# Patient Record
Sex: Female | Born: 1985 | Race: Black or African American | Hispanic: No | Marital: Single | Smoking: Current every day smoker
Health system: Southern US, Community
[De-identification: ages and names within clinical notes are randomized; demographics above are authoritative.]

## PROBLEM LIST (undated history)

## (undated) DIAGNOSIS — O009 Unspecified ectopic pregnancy without intrauterine pregnancy: Secondary | ICD-10-CM

## (undated) DIAGNOSIS — N760 Acute vaginitis: Secondary | ICD-10-CM

## (undated) DIAGNOSIS — F32A Depression, unspecified: Secondary | ICD-10-CM

## (undated) DIAGNOSIS — E669 Obesity, unspecified: Secondary | ICD-10-CM

## (undated) DIAGNOSIS — F329 Major depressive disorder, single episode, unspecified: Secondary | ICD-10-CM

## (undated) DIAGNOSIS — F419 Anxiety disorder, unspecified: Secondary | ICD-10-CM

## (undated) HISTORY — DX: Acute vaginitis: N76.0

## (undated) HISTORY — DX: Depression, unspecified: F32.A

## (undated) HISTORY — DX: Unspecified ectopic pregnancy without intrauterine pregnancy: O00.90

## (undated) HISTORY — DX: Anxiety disorder, unspecified: F41.9

## (undated) HISTORY — DX: Obesity, unspecified: E66.9

## (undated) HISTORY — PX: TONSILLECTOMY: SUR1361

---
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
## 2004-11-14 ENCOUNTER — Emergency Department: Payer: Self-pay | Admitting: Internal Medicine

## 2004-11-30 HISTORY — PX: ECTOPIC PREGNANCY SURGERY: SHX613

## 2005-09-30 ENCOUNTER — Ambulatory Visit: Payer: Self-pay | Admitting: Family Medicine

## 2005-10-09 ENCOUNTER — Ambulatory Visit: Payer: Self-pay | Admitting: Obstetrics and Gynecology

## 2005-11-30 HISTORY — PX: TONSILLECTOMY: SUR1361

## 2006-02-09 ENCOUNTER — Ambulatory Visit: Payer: Self-pay | Admitting: Otolaryngology

## 2006-10-11 ENCOUNTER — Emergency Department: Payer: Self-pay | Admitting: Emergency Medicine

## 2007-01-06 ENCOUNTER — Observation Stay: Payer: Self-pay | Admitting: Obstetrics and Gynecology

## 2007-03-07 ENCOUNTER — Observation Stay: Payer: Self-pay | Admitting: Obstetrics and Gynecology

## 2007-03-23 ENCOUNTER — Observation Stay: Payer: Self-pay | Admitting: Obstetrics and Gynecology

## 2007-04-06 ENCOUNTER — Ambulatory Visit: Payer: Self-pay | Admitting: Obstetrics and Gynecology

## 2007-04-17 ENCOUNTER — Observation Stay: Payer: Self-pay | Admitting: Obstetrics and Gynecology

## 2007-04-22 ENCOUNTER — Observation Stay: Payer: Self-pay | Admitting: Obstetrics and Gynecology

## 2007-04-23 ENCOUNTER — Inpatient Hospital Stay: Payer: Self-pay | Admitting: Obstetrics and Gynecology

## 2008-11-30 HISTORY — PX: DILATION AND CURETTAGE OF UTERUS: SHX78

## 2008-12-22 ENCOUNTER — Emergency Department: Payer: Self-pay | Admitting: Emergency Medicine

## 2009-06-03 ENCOUNTER — Observation Stay: Payer: Self-pay

## 2009-07-22 ENCOUNTER — Observation Stay: Payer: Self-pay

## 2009-07-24 ENCOUNTER — Observation Stay: Payer: Self-pay

## 2009-07-30 ENCOUNTER — Observation Stay: Payer: Self-pay | Admitting: Unknown Physician Specialty

## 2009-08-03 ENCOUNTER — Inpatient Hospital Stay: Payer: Self-pay | Admitting: Obstetrics & Gynecology

## 2009-08-16 ENCOUNTER — Ambulatory Visit: Payer: Self-pay | Admitting: Obstetrics & Gynecology

## 2010-03-21 ENCOUNTER — Emergency Department: Payer: Self-pay

## 2012-04-05 ENCOUNTER — Ambulatory Visit: Payer: Self-pay | Admitting: Internal Medicine

## 2017-05-30 ENCOUNTER — Emergency Department (HOSPITAL_BASED_OUTPATIENT_CLINIC_OR_DEPARTMENT_OTHER): Payer: BLUE CROSS/BLUE SHIELD

## 2017-05-30 ENCOUNTER — Emergency Department (HOSPITAL_BASED_OUTPATIENT_CLINIC_OR_DEPARTMENT_OTHER)
Admission: EM | Admit: 2017-05-30 | Discharge: 2017-05-31 | Disposition: A | Discharge status: Home / Self Care | Payer: BLUE CROSS/BLUE SHIELD | Attending: Emergency Medicine | Admitting: Emergency Medicine

## 2017-05-30 ENCOUNTER — Encounter (HOSPITAL_BASED_OUTPATIENT_CLINIC_OR_DEPARTMENT_OTHER): Payer: Self-pay | Admitting: *Deleted

## 2017-05-30 DIAGNOSIS — R1030 Lower abdominal pain, unspecified: Secondary | ICD-10-CM | POA: Diagnosis present

## 2017-05-30 DIAGNOSIS — R109 Unspecified abdominal pain: Secondary | ICD-10-CM

## 2017-05-30 DIAGNOSIS — F17200 Nicotine dependence, unspecified, uncomplicated: Secondary | ICD-10-CM | POA: Insufficient documentation

## 2017-05-30 LAB — CBC WITH DIFFERENTIAL/PLATELET
BASOS ABS: 0 10*3/uL (ref 0.0–0.1)
BASOS PCT: 0 %
EOS PCT: 0 %
Eosinophils Absolute: 0 10*3/uL (ref 0.0–0.7)
HCT: 38.8 % (ref 36.0–46.0)
Hemoglobin: 13.4 g/dL (ref 12.0–15.0)
Lymphocytes Relative: 15 %
Lymphs Abs: 1.6 10*3/uL (ref 0.7–4.0)
MCH: 30.6 pg (ref 26.0–34.0)
MCHC: 34.5 g/dL (ref 30.0–36.0)
MCV: 88.6 fL (ref 78.0–100.0)
MONO ABS: 0.7 10*3/uL (ref 0.1–1.0)
Monocytes Relative: 6 %
NEUTROS PCT: 79 %
Neutro Abs: 8.4 10*3/uL — ABNORMAL HIGH (ref 1.7–7.7)
PLATELETS: 278 10*3/uL (ref 150–400)
RBC: 4.38 MIL/uL (ref 3.87–5.11)
RDW: 12.5 % (ref 11.5–15.5)
WBC: 10.7 10*3/uL — AB (ref 4.0–10.5)

## 2017-05-30 LAB — COMPREHENSIVE METABOLIC PANEL
ALBUMIN: 4 g/dL (ref 3.5–5.0)
ALT: 12 U/L — ABNORMAL LOW (ref 14–54)
AST: 17 U/L (ref 15–41)
Alkaline Phosphatase: 40 U/L (ref 38–126)
Anion gap: 8 (ref 5–15)
BUN: 15 mg/dL (ref 6–20)
CHLORIDE: 107 mmol/L (ref 101–111)
CO2: 23 mmol/L (ref 22–32)
Calcium: 9.8 mg/dL (ref 8.9–10.3)
Creatinine, Ser: 1.17 mg/dL — ABNORMAL HIGH (ref 0.44–1.00)
GFR calc Af Amer: >60 mL/min (ref >60)
GFR calc non Af Amer: >60 mL/min (ref >60)
GLUCOSE: 100 mg/dL — AB (ref 65–99)
POTASSIUM: 3.8 mmol/L (ref 3.5–5.1)
SODIUM: 138 mmol/L (ref 135–145)
Total Bilirubin: 0.7 mg/dL (ref 0.3–1.2)
Total Protein: 7.5 g/dL (ref 6.5–8.1)

## 2017-05-30 LAB — URINALYSIS, ROUTINE W REFLEX MICROSCOPIC
BILIRUBIN URINE: NEGATIVE
Glucose, UA: NEGATIVE mg/dL
KETONES UR: NEGATIVE mg/dL
Nitrite: NEGATIVE
Protein, ur: NEGATIVE mg/dL
SPECIFIC GRAVITY, URINE: 1.017 (ref 1.005–1.030)
pH: 7 (ref 5.0–8.0)

## 2017-05-30 LAB — URINALYSIS, MICROSCOPIC (REFLEX)

## 2017-05-30 LAB — WET PREP, GENITAL
Clue Cells Wet Prep HPF POC: NONE SEEN
SPERM: NONE SEEN
Trich, Wet Prep: NONE SEEN
Yeast Wet Prep HPF POC: NONE SEEN

## 2017-05-30 LAB — PREGNANCY, URINE: PREG TEST UR: NEGATIVE

## 2017-05-30 MED ORDER — AZITHROMYCIN 1 G PO PACK
1.0000 g | PACK | Freq: Once | ORAL | Status: AC
  Administered 2017-05-30: 1 g via ORAL
  Filled 2017-05-30: qty 1

## 2017-05-30 MED ORDER — CEFTRIAXONE SODIUM 250 MG IJ SOLR
250.0000 mg | Freq: Once | INTRAMUSCULAR | Status: AC
  Administered 2017-05-30: 250 mg via INTRAMUSCULAR
  Filled 2017-05-30: qty 250

## 2017-05-30 MED ORDER — KETOROLAC TROMETHAMINE 15 MG/ML IJ SOLN
15.0000 mg | Freq: Once | INTRAMUSCULAR | Status: AC
  Administered 2017-05-30: 15 mg via INTRAVENOUS
  Filled 2017-05-30: qty 1

## 2017-05-30 MED ORDER — SODIUM CHLORIDE 0.9 % IV BOLUS (SEPSIS)
1000.0000 mL | Freq: Once | INTRAVENOUS | Status: AC
  Administered 2017-05-30: 1000 mL via INTRAVENOUS

## 2017-05-30 MED ORDER — LIDOCAINE HCL (PF) 1 % IJ SOLN
INTRAMUSCULAR | Status: AC
  Administered 2017-05-30: 5 mL
  Filled 2017-05-30: qty 5

## 2017-05-30 NOTE — ED Notes (Signed)
Back up to b/r, steady gait, urinary frequency noted.

## 2017-05-30 NOTE — ED Triage Notes (Signed)
Pt reports lower abdominal pain and left flank pain since yesterday. No n/v/d. Pt says that she feels urgency but only goes a small amount.

## 2017-05-30 NOTE — ED Notes (Signed)
Back up to b/r (3rd time), steady gait.

## 2017-05-30 NOTE — ED Provider Notes (Signed)
[facility] Note   CSN: [id number] Arrival date & time: 05/30/17  2053  By signing my name below, I, Marilyn Li, attest that this documentation has been prepared under the direction and in the presence of Marilyn Monday, MD. Electronically Signed: Diona Li, ED Scribe. 05/30/17. 10:14 PM.  History   Chief Complaint Chief Complaint  Patient presents with  . Abdominal Pain    HPI Marilyn Li is a 31 y.o. female who presents to the Emergency Department complaining of constant lower abdominal pain that started yesterday. Associated sx include achy left flank pain, pelvic pain, urinary urgency, and decreased urinary output. Movement exacerbates her pain. Never had abdominal pain like this before. Hx of ectopic pregnancy. Pt denies dysuria, nausea, vomiting, diarrhea, fever, vaginal bleeding, vaginal discharge and appetite change.   The history is provided by the patient. No language interpreter was used.    History reviewed. No pertinent past medical history.  There are no active problems to display for this patient.   Past Surgical History:  Procedure Laterality Date  . TONSILLECTOMY      OB History    No data available       Home Medications    Prior to Admission medications   Medication Sig Start Date End Date Taking? Authorizing Provider  cyclobenzaprine (FLEXERIL) 10 MG tablet Take 1 tablet (10 mg total) by mouth 2 (two) times daily as needed for muscle spasms. 05/31/17   Marilyn Monday, MD    Family History No family history on file.  Social History Social History  Substance Use Topics  . Smoking status: Current Every Day Smoker  . Smokeless tobacco: Not on file  . Alcohol use Yes     Allergies   Patient has no known allergies.   Review of Systems Review of Systems  Constitutional: Negative for appetite change and fever.  HENT: Negative for sore throat.   Eyes: Negative for visual disturbance.  Respiratory:  Negative for cough and shortness of breath.   Cardiovascular: Negative for chest pain.  Gastrointestinal: Positive for abdominal pain. Negative for diarrhea, nausea and vomiting.  Genitourinary: Positive for decreased urine volume, flank pain and urgency. Negative for difficulty urinating, vaginal bleeding and vaginal discharge.  Musculoskeletal: Negative for back pain and neck pain.  Skin: Negative for rash.  Neurological: Negative for syncope and headaches.     Physical Exam Updated Vital Signs BP 116/77 (BP Location: Left Arm)   Pulse 80   Temp 99.5 F (37.5 C) (Oral)   Resp 20   LMP 05/23/2017   SpO2 100%   Physical Exam  Constitutional: She is oriented to person, place, and time. She appears well-developed and well-nourished. No distress.  HENT:  Head: Normocephalic and atraumatic.  Eyes: Conjunctivae and EOM are normal.  Neck: Normal range of motion.  Cardiovascular: Normal rate, regular rhythm, normal heart sounds and intact distal pulses.  Exam reveals no gallop and no friction rub.   No murmur heard. Pulmonary/Chest: Effort normal and breath sounds normal. No respiratory distress. She has no wheezes. She has no rales.  Abdominal: Soft. She exhibits no distension. There is no tenderness. There is no guarding.  Mild bilateral lower abdominal tenderness.   Genitourinary: Cervix exhibits no motion tenderness and no discharge. Right adnexum displays no tenderness. Left adnexum displays no tenderness.  Musculoskeletal: She exhibits no edema or tenderness.  No tenderness in the left lower back. No CVA tenderness.   Neurological: She is alert and oriented to person,  place, and time.  Skin: Skin is warm and dry. No rash noted. She is not diaphoretic. No erythema.  Nursing note and vitals reviewed.   ED Treatments / Results  DIAGNOSTIC STUDIES: Oxygen Saturation is 98% on RA, normal by my interpretation.   COORDINATION OF CARE: 10:14 PM-Discussed next steps with pt which  includes a CT scan and following up with her PCP. Pt verbalized understanding and is agreeable with the plan.   Labs (all labs ordered are listed, but only abnormal results are displayed) Labs Reviewed  WET PREP, GENITAL - Abnormal; Notable for the following:       Result Value   WBC, Wet Prep HPF POC MANY (*)    All other components within normal limits  URINE CULTURE - Abnormal; Notable for the following:    Culture MULTIPLE SPECIES PRESENT, SUGGEST RECOLLECTION (*)    All other components within normal limits  URINALYSIS, ROUTINE W REFLEX MICROSCOPIC - Abnormal; Notable for the following:    APPearance CLOUDY (*)    Hgb urine dipstick MODERATE (*)    Leukocytes, UA TRACE (*)    All other components within normal limits  URINALYSIS, MICROSCOPIC (REFLEX) - Abnormal; Notable for the following:    Bacteria, UA FEW (*)    Squamous Epithelial / LPF 6-30 (*)    All other components within normal limits  CBC WITH DIFFERENTIAL/PLATELET - Abnormal; Notable for the following:    WBC 10.7 (*)    Neutro Abs 8.4 (*)    All other components within normal limits  COMPREHENSIVE METABOLIC PANEL - Abnormal; Notable for the following:    Glucose, Bld 100 (*)    Creatinine, Ser 1.17 (*)    ALT 12 (*)    All other components within normal limits  PREGNANCY, URINE  GC/CHLAMYDIA PROBE AMP ([place]) NOT AT [facility]    EKG  EKG Interpretation None       Radiology No results found.  Procedures Procedures (including critical care time)  Medications Ordered in ED Medications  ketorolac (TORADOL) 15 MG/ML injection 15 mg (15 mg Intravenous Given 05/30/17 2259)  sodium chloride 0.9 % bolus 1,000 mL (0 mLs Intravenous Stopped 05/30/17 2328)  cefTRIAXone (ROCEPHIN) injection 250 mg (250 mg Intramuscular Given 05/30/17 2345)  azithromycin (ZITHROMAX) powder 1 g (1 g Oral Given 05/30/17 2345)  lidocaine (PF) (XYLOCAINE) 1 % injection (5 mLs  Given 05/30/17 2345)     Initial Impression / Assessment and  Plan / ED Course  I have reviewed the triage vital signs and the nursing notes.  Pertinent labs & imaging results that were available during my care of the patient were reviewed by me and considered in my medical decision making (see chart for details).     31yo female presents with concern for lower abdominal and flank pain.  Pt with urinary symptoms however no sign of urinary tract infection on urinalysis. RBCs present, and discussed getting CT scan however pt without severe colicky pain and symptoms a bit atypical for nephrolithiasis. Pt declines CT and given symptoms feel this is reasonable. Doubt appendicitis, diverticulitis, obstruction. Wet prep shows no significant findings. No significant discharge or tenderness to suggest PID. Pt give empiric tx with azithromycin/rocephin.  Recommend hydration, PCP follow up. Possible MSK pain, less likely nephrolithiasis. Patient discharged in stable condition with understanding of reasons to return.    I personally performed the services described in this documentation, which was scribed in my presence. The recorded information has been reviewed and is  accurate.   Final Clinical Impressions(s) / ED Diagnoses   Final diagnoses:  Flank pain  Lower abdominal pain    New Prescriptions There are no discharge medications for this patient.    Marilyn MondaySchlossman, Arcenio Mullaly, MD 06/01/17 1210

## 2017-05-30 NOTE — ED Notes (Signed)
Up to b/r, steady gait 

## 2017-05-31 MED ORDER — CYCLOBENZAPRINE HCL 10 MG PO TABS: 10.0000 mg | ORAL_TABLET | Freq: Two times a day (BID) | ORAL | 0 refills | Status: DC | PRN

## 2017-05-31 NOTE — ED Notes (Signed)
EDP in to speak with pt about results and d/c plan.  

## 2017-06-01 LAB — URINE CULTURE

## 2017-11-16 ENCOUNTER — Ambulatory Visit: Payer: BLUE CROSS/BLUE SHIELD | Admitting: Nurse Practitioner

## 2017-11-16 ENCOUNTER — Encounter: Payer: Self-pay | Admitting: Nurse Practitioner

## 2017-11-16 VITALS — BP 130/80 | HR 72 | Temp 98.2°F | Resp 16 | Ht 64.0 in | Wt 253.0 lb

## 2017-11-16 DIAGNOSIS — Z0001 Encounter for general adult medical examination with abnormal findings: Secondary | ICD-10-CM | POA: Insufficient documentation

## 2017-11-16 DIAGNOSIS — N390 Urinary tract infection, site not specified: Secondary | ICD-10-CM | POA: Diagnosis not present

## 2017-11-16 DIAGNOSIS — B3731 Acute candidiasis of vulva and vagina: Secondary | ICD-10-CM

## 2017-11-16 DIAGNOSIS — B373 Candidiasis of vulva and vagina: Secondary | ICD-10-CM

## 2017-11-16 DIAGNOSIS — N760 Acute vaginitis: Secondary | ICD-10-CM | POA: Diagnosis not present

## 2017-11-16 DIAGNOSIS — R319 Hematuria, unspecified: Secondary | ICD-10-CM | POA: Diagnosis not present

## 2017-11-16 LAB — POCT URINALYSIS DIPSTICK
GLUCOSE UA: NEGATIVE
Ketones, UA: NEGATIVE
Leukocytes, UA: NEGATIVE
Nitrite, UA: NEGATIVE
PH UA: 7 (ref 5.0–8.0)
Spec Grav, UA: 1.01 (ref 1.010–1.025)
Urobilinogen, UA: 0.2 E.U./dL

## 2017-11-16 MED ORDER — METRONIDAZOLE 500 MG PO TABS: 500.0000 mg | ORAL_TABLET | Freq: Two times a day (BID) | ORAL | 0 refills | Status: AC

## 2017-11-16 MED ORDER — CIPROFLOXACIN HCL 500 MG PO TABS: 500.0000 mg | ORAL_TABLET | Freq: Two times a day (BID) | ORAL | 0 refills | Status: AC

## 2017-11-16 MED ORDER — FLUCONAZOLE 150 MG PO TABS: 150.0000 mg | ORAL_TABLET | Freq: Once | ORAL | 0 refills | Status: DC

## 2017-11-16 MED ORDER — CIPROFLOXACIN HCL 500 MG PO TABS: 500.0000 mg | ORAL_TABLET | Freq: Two times a day (BID) | ORAL | 0 refills | Status: DC

## 2017-11-16 MED ORDER — METRONIDAZOLE 500 MG PO TABS: 500.0000 mg | ORAL_TABLET | Freq: Three times a day (TID) | ORAL | 0 refills | Status: DC

## 2017-11-16 NOTE — Progress Notes (Signed)
Subjective:     Patient ID: Marilyn GrahamLatasha M Conant, female   DOB: [birth year], 31 y.o.   MRN: [id number]  Patient has noted vaginal discharge with odor for about a week. Odor is described as "fishy" and she is also having some vaginal itching and irriitation.  She started having right flank pain a few days ago. She describes this as slight pain. She is reporting some urgency but no burning or pain when she urinates. She has noted no blood in the urine.      Review of Systems  Constitutional: Negative for activity change, appetite change, fatigue and fever.  HENT: Negative.   Eyes: Negative.   Respiratory: Negative for chest tightness and shortness of breath.   Cardiovascular: Negative for chest pain and palpitations.  Gastrointestinal: Positive for abdominal pain.       Abdominal pain is along the pelvic area.   Endocrine: Negative.   Genitourinary: Positive for dysuria, flank pain, frequency, urgency, vaginal discharge and vaginal pain.  Allergic/Immunologic: Negative.   Neurological: Negative.   Hematological: Negative.   Psychiatric/Behavioral: Negative.        Objective:   Physical Exam  Constitutional: She is oriented to person, place, and time. She appears well-developed and well-nourished.  HENT:  Head: Normocephalic and atraumatic.  Neck: Normal range of motion. Neck supple.  Cardiovascular: Normal rate and regular rhythm.  Pulmonary/Chest: Effort normal and breath sounds normal.  Abdominal: Soft. Normal appearance. There is tenderness in the suprapubic area.  Genitourinary:  Genitourinary Comments: The patient's urine sample is positive for moderate blood and small WBC.   Musculoskeletal: Normal range of motion.  Neurological: She is alert and oriented to person, place, and time.  Skin: Skin is warm and dry.  Psychiatric: She has a normal mood and affect.       Assessment:     Candidal vaginitis - Plan: fluconazole (DIFLUCAN) 150 MG tablet, DISCONTINUED: fluconazole  (DIFLUCAN) 150 MG tablet  Encounter for well adult exam with abnormal findings - Plan: POCT Urinalysis Dipstick  Urinary tract infection with hematuria, site unspecified - Plan: Urine Culture, ciprofloxacin (CIPRO) 500 MG tablet, DISCONTINUED: ciprofloxacin (CIPRO) 500 MG tablet  Vaginitis and vulvovaginitis - Plan: metroNIDAZOLE (FLAGYL) 500 MG tablet     Plan:     1. uti - treat wit hciprofloxacin 500mg  bid for 7 days. Send urine for culture and sensitivity and adjust abx as indicated.  2. Vaginitis - likely bacterial in nature. Metronidazole 500mg  BID for 7 days given. Advised patient not to consume any alcohol while taking this medicatin. She voiced understanding.  3. Candidal vaginitis - diflucan prescrpbed in case yeast infection develops. Use as needed and as prescribed   Return in 2 weeks to recheck u/a for infection

## 2017-11-18 LAB — URINE CULTURE

## 2017-12-17 ENCOUNTER — Ambulatory Visit: Payer: Self-pay | Admitting: Nurse Practitioner

## 2018-05-27 ENCOUNTER — Ambulatory Visit: Payer: BLUE CROSS/BLUE SHIELD | Admitting: Nurse Practitioner

## 2018-05-27 ENCOUNTER — Encounter: Payer: Self-pay | Admitting: Nurse Practitioner

## 2018-05-27 VITALS — BP 120/74 | HR 65 | Resp 16 | Ht 63.0 in | Wt 250.6 lb

## 2018-05-27 DIAGNOSIS — B9689 Other specified bacterial agents as the cause of diseases classified elsewhere: Secondary | ICD-10-CM

## 2018-05-27 DIAGNOSIS — N390 Urinary tract infection, site not specified: Secondary | ICD-10-CM | POA: Diagnosis not present

## 2018-05-27 DIAGNOSIS — R300 Dysuria: Secondary | ICD-10-CM | POA: Insufficient documentation

## 2018-05-27 DIAGNOSIS — N760 Acute vaginitis: Secondary | ICD-10-CM | POA: Diagnosis not present

## 2018-05-27 DIAGNOSIS — R319 Hematuria, unspecified: Secondary | ICD-10-CM

## 2018-05-27 DIAGNOSIS — B379 Candidiasis, unspecified: Secondary | ICD-10-CM | POA: Diagnosis not present

## 2018-05-27 LAB — POCT URINALYSIS DIPSTICK
Bilirubin, UA: NEGATIVE
Glucose, UA: NEGATIVE
Ketones, UA: NEGATIVE
LEUKOCYTES UA: NEGATIVE
Nitrite, UA: NEGATIVE
PH UA: 6.5 (ref 5.0–8.0)
PROTEIN UA: NEGATIVE
Spec Grav, UA: 1.01 (ref 1.010–1.025)
UROBILINOGEN UA: 0.2 U/dL

## 2018-05-27 MED ORDER — CIPROFLOXACIN HCL 500 MG PO TABS: 500.0000 mg | ORAL_TABLET | Freq: Two times a day (BID) | ORAL | 0 refills | Status: DC

## 2018-05-27 MED ORDER — FLUCONAZOLE 150 MG PO TABS: ORAL_TABLET | ORAL | 0 refills | Status: DC

## 2018-05-27 MED ORDER — METRONIDAZOLE 0.75 % VA GEL: VAGINAL | 1 refills | Status: DC

## 2018-05-27 NOTE — Progress Notes (Signed)
[facility] [address] [place], [place] [address]  Internal MEDICINE  Office Visit Note  Patient Name: Marilyn GrahamLatasha M Li  [id number]  [id number]  Date of Service: 05/27/2018   Pt is here for a sick visit.  Chief Complaint  Patient presents with  . Vaginal Discharge    odor, and itching  . Abdominal Pain    been going on for about a week. hurts when cough.      The patient has been having pelvic pain with discharge and odor. Has been going on for about a week. She is also reporting urgency with urination. Denies nausea,, vomiting, or diarrhea. Deneis fever.        Current Medication:  Outpatient Encounter Medications as of 05/27/2018  Medication Sig  . ciprofloxacin (CIPRO) 500 MG tablet Take 1 tablet (500 mg total) by mouth 2 (two) times daily.  . cyclobenzaprine (FLEXERIL) 10 MG tablet Take 1 tablet (10 mg total) by mouth 2 (two) times daily as needed for muscle spasms. (Patient not taking: Reported on 05/27/2018)  . fluconazole (DIFLUCAN) 150 MG tablet Take 1 tablet po once. May repeat dose in 3 days as needed for persistent symptoms.  . metroNIDAZOLE (METROGEL) 0.75 % vaginal gel Use 1 applicator vaginally every night for 7 nights   No facility-administered encounter medications on file as of 05/27/2018.       Medical History: History reviewed. No pertinent past medical history.   Today's Vitals   05/27/18 0841  BP: 120/74  Pulse: 65  Resp: 16  SpO2: 100%  Weight: 250 lb 9.6 oz (113.7 kg)  Height: 5\' 3"  (1.6 m)   Review of Systems  Constitutional: Negative for activity change, appetite change, fatigue and fever.  HENT: Negative for congestion, postnasal drip, rhinorrhea and sore throat.   Eyes: Negative.   Respiratory: Negative for chest tightness, shortness of breath and wheezing.   Cardiovascular: Negative for chest pain and palpitations.  Gastrointestinal: Positive for abdominal pain and nausea. Negative for constipation, diarrhea and  vomiting.       Abdominal pain is along the pelvic area.   Endocrine: Negative for cold intolerance, heat intolerance, polydipsia, polyphagia and polyuria.  Genitourinary: Positive for dysuria, flank pain, frequency, urgency, vaginal discharge and vaginal pain.  Musculoskeletal: Negative for arthralgias, back pain and myalgias.  Skin: Negative for rash.  Allergic/Immunologic: Negative for environmental allergies.  Neurological: Negative for headaches.  Hematological: Negative for adenopathy.  Psychiatric/Behavioral: Negative for decreased concentration, dysphoric mood and sleep disturbance.    Physical Exam  Constitutional: She is oriented to person, place, and time. She appears well-developed and well-nourished. No distress.  HENT:  Head: Normocephalic and atraumatic.  Mouth/Throat: Oropharynx is clear and moist. No oropharyngeal exudate.  Eyes: Pupils are equal, round, and reactive to light. EOM are normal.  Neck: Normal range of motion. Neck supple. No JVD present. No tracheal deviation present. No thyromegaly present.  Cardiovascular: Normal rate, regular rhythm and normal heart sounds. Exam reveals no gallop and no friction rub.  No murmur heard. Pulmonary/Chest: Effort normal and breath sounds normal. No respiratory distress. She has no wheezes. She has no rales. She exhibits no tenderness.  Abdominal: Soft. Bowel sounds are normal. There is tenderness in the suprapubic area. No hernia.  Genitourinary: Vaginal discharge found.  Genitourinary Comments: There is small amount of yellow vaginal discharge with slight odor.   Musculoskeletal: Normal range of motion.  Lymphadenopathy:    She has no cervical adenopathy.  Neurological: She is alert  and oriented to person, place, and time. No cranial nerve deficit.  Skin: Skin is warm and dry. She is not diaphoretic.  Psychiatric: She has a normal mood and affect. Her behavior is normal. Judgment and thought content normal.  Nursing note  and vitals reviewed.  Assessment/Plan: 1. Bacterial vaginitis Start metronidazole vaginal gel. Use 1 applicatorful every night for 7 nights. NuSwab testing for bacterial/yeast vaginitis was obtained today.  - metroNIDAZOLE (METROGEL) 0.75 % vaginal gel; Use 1 applicator vaginally every night for 7 nights  Dispense: 70 g; Refill: 1 - NuSwab Vaginitis Plus (VG+)  2. Candidiasis Add diflucan 150mg  once for yeast infection. May repeat dose in 3 days for persistent symptoms.  - fluconazole (DIFLUCAN) 150 MG tablet; Take 1 tablet po once. May repeat dose in 3 days as needed for persistent symptoms.  Dispense: 3 tablet; Refill: 0  3. Urinary tract infection with hematuria, site unspecified Treat with cipro 500mg  bid for 10 days. Send urine for culture and sensitivity and adjust antibiotics as indicated.  - ciprofloxacin (CIPRO) 500 MG tablet; Take 1 tablet (500 mg total) by mouth 2 (two) times daily.  Dispense: 20 tablet; Refill: 0 - CULTURE, URINE COMPREHENSIVE  4. Dysuria - POCT Urinalysis Dipstick  General Counseling: Shawnese verbalizes understanding of the findings of todays visit and agrees with plan of treatment. I have discussed any further diagnostic evaluation that may be needed or ordered today. We also reviewed her medications today. she has been encouraged to call the office with any questions or concerns that should arise related to todays visit.    Counseling:  This patient was seen by Vincent Gros, FNP- C in Collaboration with Dr [name] as a part of collaborative care agreement  Orders Placed This Encounter  Procedures  . CULTURE, URINE COMPREHENSIVE  . NuSwab Vaginitis Plus (VG+)  . POCT Urinalysis Dipstick    Meds ordered this encounter  Medications  . fluconazole (DIFLUCAN) 150 MG tablet    Sig: Take 1 tablet po once. May repeat dose in 3 days as needed for persistent symptoms.    Dispense:  3 tablet    Refill:  0    Order Specific Question:   Supervising  Provider    Answer:   [name] [[id number]]  . ciprofloxacin (CIPRO) 500 MG tablet    Sig: Take 1 tablet (500 mg total) by mouth 2 (two) times daily.    Dispense:  20 tablet    Refill:  0    Order Specific Question:   Supervising Provider    Answer:   [name] [[id number]]  . metroNIDAZOLE (METROGEL) 0.75 % vaginal gel    Sig: Use 1 applicator vaginally every night for 7 nights    Dispense:  70 g    Refill:  1    Order Specific Question:   Supervising Provider    Answer:   [name] [[id number]]    Time spent: 25 Minutes

## 2018-05-30 LAB — CULTURE, URINE COMPREHENSIVE

## 2018-06-01 LAB — NUSWAB VAGINITIS PLUS (VG+)
ATOPOBIUM VAGINAE: HIGH {score} — AB
BVAB 2: HIGH {score} — AB
CANDIDA ALBICANS, NAA: NEGATIVE
CANDIDA GLABRATA, NAA: NEGATIVE
Chlamydia trachomatis, NAA: NEGATIVE
MEGASPHAERA 1: HIGH {score} — AB
NEISSERIA GONORRHOEAE, NAA: NEGATIVE
Trich vag by NAA: NEGATIVE

## 2018-06-09 ENCOUNTER — Other Ambulatory Visit: Payer: Self-pay

## 2018-06-09 MED ORDER — JUNEL FE 1/20 1-20 MG-MCG PO TABS: ORAL_TABLET | ORAL | 4 refills | Status: DC

## 2018-06-20 ENCOUNTER — Other Ambulatory Visit: Payer: Self-pay | Admitting: Nurse Practitioner

## 2018-06-20 MED ORDER — JUNEL FE 1/20 1-20 MG-MCG PO TABS: ORAL_TABLET | ORAL | 11 refills | Status: DC

## 2018-07-05 ENCOUNTER — Other Ambulatory Visit: Payer: Self-pay | Admitting: Nurse Practitioner

## 2018-07-05 MED ORDER — JUNEL FE 1/20 1-20 MG-MCG PO TABS: ORAL_TABLET | ORAL | 11 refills | Status: DC

## 2018-07-11 ENCOUNTER — Other Ambulatory Visit: Payer: Self-pay | Admitting: Nurse Practitioner

## 2018-07-11 MED ORDER — JUNEL FE 1/20 1-20 MG-MCG PO TABS: ORAL_TABLET | ORAL | 11 refills | Status: DC

## 2018-07-15 ENCOUNTER — Ambulatory Visit (INDEPENDENT_AMBULATORY_CARE_PROVIDER_SITE_OTHER): Payer: BLUE CROSS/BLUE SHIELD | Admitting: Nurse Practitioner

## 2018-07-15 ENCOUNTER — Encounter: Payer: Self-pay | Admitting: Nurse Practitioner

## 2018-07-15 VITALS — BP 133/83 | HR 87 | Resp 16 | Ht 64.0 in | Wt 237.4 lb

## 2018-07-15 DIAGNOSIS — F411 Generalized anxiety disorder: Secondary | ICD-10-CM | POA: Diagnosis not present

## 2018-07-15 DIAGNOSIS — R5383 Other fatigue: Secondary | ICD-10-CM

## 2018-07-15 DIAGNOSIS — F321 Major depressive disorder, single episode, moderate: Secondary | ICD-10-CM | POA: Diagnosis not present

## 2018-07-15 MED ORDER — BUSPIRONE HCL 10 MG PO TABS: ORAL_TABLET | ORAL | 2 refills | Status: DC

## 2018-07-15 NOTE — Progress Notes (Signed)
[facility] [address] [place], [place] [address]  Internal MEDICINE  Office Visit Note  Patient Name: Onnie GrahamLatasha M Henderson  [id number]  [id number]  Date of Service: 07/27/2018   Pt is here for a sick visit.  Chief Complaint  Patient presents with  . Depression    been going on abut a couple of wks  . Anxiety     The patient admits to having new relationship problems with her boyfriend. This relationship has been going on for past 3 years. She has considered marriage with this man. They have recently been arguing frequently. After an argument, recently, she left and went to stay with her mother. After a few days, she tried to go home and found locks changed and she was unable to get into the home and get her things and her furniture, which is in her name. The patient is very sad. Finds she is crying very often. Nauseated and does not have much of appetite. She denies being suicidal or homicidal.        Current Medication:  Outpatient Encounter Medications as of 07/15/2018  Medication Sig  . ciprofloxacin (CIPRO) 500 MG tablet Take 1 tablet (500 mg total) by mouth 2 (two) times daily.  . fluconazole (DIFLUCAN) 150 MG tablet Take 1 tablet po once. May repeat dose in 3 days as needed for persistent symptoms.  Colleen Can. JUNEL FE 1/20 1-20 MG-MCG tablet Take 1 tab po as directed  . metroNIDAZOLE (METROGEL) 0.75 % vaginal gel Use 1 applicator vaginally every night for 7 nights  . busPIRone (BUSPAR) 10 MG tablet Take 1/2 to 1 tablet po BID prn anxiety  . cyclobenzaprine (FLEXERIL) 10 MG tablet Take 1 tablet (10 mg total) by mouth 2 (two) times daily as needed for muscle spasms. (Patient not taking: Reported on 05/27/2018)   No facility-administered encounter medications on file as of 07/15/2018.       Medical History: History reviewed. No pertinent past medical history.   Today's Vitals   07/15/18 1530  BP: 133/83  Pulse: 87  Resp: 16  SpO2: 99%  Weight: 237 lb 6.4 oz  (107.7 kg)  Height: 5\' 4"  (1.626 m)   Review of Systems  Constitutional: Positive for appetite change and fatigue. Negative for chills and unexpected weight change.  HENT: Negative for congestion, postnasal drip, rhinorrhea, sneezing and sore throat.   Eyes: Negative.  Negative for redness.  Respiratory: Negative for cough, chest tightness, shortness of breath and wheezing.   Cardiovascular: Negative for chest pain and palpitations.  Gastrointestinal: Positive for nausea. Negative for abdominal pain, constipation and diarrhea.  Endocrine: Negative for cold intolerance, heat intolerance, polydipsia, polyphagia and polyuria.  Genitourinary: Negative for dysuria and frequency.  Musculoskeletal: Negative for arthralgias, back pain, joint swelling and neck pain.  Skin: Negative for rash.  Allergic/Immunologic: Negative for environmental allergies.  Neurological: Positive for headaches. Negative for tremors and numbness.  Hematological: Negative for adenopathy. Does not bruise/bleed easily.  Psychiatric/Behavioral: Positive for dysphoric mood and sleep disturbance. Negative for behavioral problems (Depression) and suicidal ideas. The patient is nervous/anxious.     Physical Exam  Constitutional: She is oriented to person, place, and time. She appears well-developed and well-nourished. No distress.  HENT:  Head: Normocephalic and atraumatic.  Nose: Nose normal.  Mouth/Throat: Oropharynx is clear and moist. No oropharyngeal exudate.  Eyes: Pupils are equal, round, and reactive to light. Conjunctivae and EOM are normal.  Neck: Normal range of motion. Neck supple. No JVD present. No  tracheal deviation present. No thyromegaly present.  Cardiovascular: Normal rate, regular rhythm and normal heart sounds. Exam reveals no gallop and no friction rub.  No murmur heard. Pulmonary/Chest: Effort normal and breath sounds normal. No respiratory distress. She has no wheezes. She has no rales. She exhibits no  tenderness.  Abdominal: Soft. Bowel sounds are normal. There is no tenderness.  Musculoskeletal: Normal range of motion.  Lymphadenopathy:    She has no cervical adenopathy.  Neurological: She is alert and oriented to person, place, and time. No cranial nerve deficit.  Skin: Skin is warm and dry. She is not diaphoretic.  Psychiatric: Her speech is normal and behavior is normal. Judgment and thought content normal. Her mood appears anxious. Thought content is not delusional. Cognition and memory are normal. She exhibits a depressed mood. She expresses no homicidal and no suicidal ideation. She expresses no suicidal plans and no homicidal plans.  Nursing note and vitals reviewed.  Assessment/Plan:  1. Other fatigue Likely related to increased anxiety and depression. Will monitor.   2. Current moderate episode of major depressive disorder without prior episode (HCC) Refer to psychiatry/counseling services for continued treatment.   3. Generalized anxiety disorder Add buspirone 10mg  which may be taken up to twice daily as needed for acute anxiety.  - Ambulatory referral to Psychiatry - busPIRone (BUSPAR) 10 MG tablet; Take 1/2 to 1 tablet po BID prn anxiety  Dispense: 60 tablet; Refill: 2   General Counseling: Ashlley verbalizes understanding of the findings of todays visit and agrees with plan of treatment. I have discussed any further diagnostic evaluation that may be needed or ordered today. We also reviewed her medications today. she has been encouraged to call the office with any questions or concerns that should arise related to todays visit.    Counseling:  This patient was seen by Vincent GrosHeather Whittaker Lenis FNP Collaboration with Dr Lyndon CodeFozia M Khan as a part of collaborative care agreement   Orders Placed This Encounter  Procedures  . Ambulatory referral to Psychiatry    Meds ordered this encounter  Medications  . busPIRone (BUSPAR) 10 MG tablet    Sig: Take 1/2 to 1 tablet po BID prn  anxiety    Dispense:  60 tablet    Refill:  2    Order Specific Question:   Supervising Provider    Answer:   Lyndon CodeKHAN, FOZIA M [[id number]]    Time spent:30 Minutes

## 2018-07-27 DIAGNOSIS — R5383 Other fatigue: Secondary | ICD-10-CM | POA: Insufficient documentation

## 2018-07-27 DIAGNOSIS — F411 Generalized anxiety disorder: Secondary | ICD-10-CM | POA: Insufficient documentation

## 2018-07-27 DIAGNOSIS — F321 Major depressive disorder, single episode, moderate: Secondary | ICD-10-CM | POA: Insufficient documentation

## 2018-07-27 DIAGNOSIS — F3341 Major depressive disorder, recurrent, in partial remission: Secondary | ICD-10-CM | POA: Insufficient documentation

## 2018-07-29 ENCOUNTER — Ambulatory Visit: Payer: Self-pay | Admitting: Nurse Practitioner

## 2018-12-20 ENCOUNTER — Encounter: Payer: Self-pay | Admitting: Nurse Practitioner

## 2018-12-20 ENCOUNTER — Ambulatory Visit: Payer: BLUE CROSS/BLUE SHIELD | Admitting: Nurse Practitioner

## 2018-12-20 VITALS — BP 133/83 | HR 84 | Resp 16 | Ht 64.0 in | Wt 244.8 lb

## 2018-12-20 DIAGNOSIS — R319 Hematuria, unspecified: Secondary | ICD-10-CM

## 2018-12-20 DIAGNOSIS — N390 Urinary tract infection, site not specified: Secondary | ICD-10-CM | POA: Diagnosis not present

## 2018-12-20 DIAGNOSIS — R300 Dysuria: Secondary | ICD-10-CM | POA: Diagnosis not present

## 2018-12-20 DIAGNOSIS — N760 Acute vaginitis: Secondary | ICD-10-CM

## 2018-12-20 DIAGNOSIS — B9689 Other specified bacterial agents as the cause of diseases classified elsewhere: Secondary | ICD-10-CM

## 2018-12-20 LAB — POCT URINALYSIS DIPSTICK
Bilirubin, UA: NEGATIVE
Glucose, UA: NEGATIVE
KETONES UA: NEGATIVE
Leukocytes, UA: NEGATIVE
NITRITE UA: NEGATIVE
PROTEIN UA: POSITIVE — AB
Spec Grav, UA: <=1.005 — AB (ref 1.010–1.025)
Urobilinogen, UA: 0.2 E.U./dL
pH, UA: 8.5 — AB (ref 5.0–8.0)

## 2018-12-20 MED ORDER — SULFAMETHOXAZOLE-TRIMETHOPRIM 800-160 MG PO TABS: 1.0000 | ORAL_TABLET | Freq: Two times a day (BID) | ORAL | 0 refills | Status: DC

## 2018-12-20 MED ORDER — METRONIDAZOLE 0.75 % VA GEL: VAGINAL | 1 refills | Status: DC

## 2018-12-20 NOTE — Progress Notes (Signed)
[facility] [address] [place], [place] [address]  Internal MEDICINE  Office Visit Note  Patient Name: Marilyn GrahamLatasha M Li  [id number]-Jan-1987  [id number]  Date of Service: 12/20/2018   Pt is here for a sick visit.  Chief Complaint  Patient presents with  . Vaginal Discharge    discharge has an odor, symptoms started a week ago, minor itching, no pain when urinating  . Pain    abdominal pain, statred a couple of days ago     The patient is having vaginal discharge along with lower abdominal pain for several days. Lower back is also hurting. Has been going on for several days without improvement. Denies dysuria or frequency of urination. Denies nausea, vomiting, or diarrhea.        Current Medication:  Outpatient Encounter Medications as of 12/20/2018  Medication Sig  . busPIRone (BUSPAR) 10 MG tablet Take 1/2 to 1 tablet po BID prn anxiety  . JUNEL FE 1/20 1-20 MG-MCG tablet Take 1 tab po as directed  . metroNIDAZOLE (METROGEL) 0.75 % vaginal gel Use 1 applicator vaginally every night for 7 nights  . [DISCONTINUED] metroNIDAZOLE (METROGEL) 0.75 % vaginal gel Use 1 applicator vaginally every night for 7 nights  . cyclobenzaprine (FLEXERIL) 10 MG tablet Take 1 tablet (10 mg total) by mouth 2 (two) times daily as needed for muscle spasms. (Patient not taking: Reported on 05/27/2018)  . fluconazole (DIFLUCAN) 150 MG tablet Take 1 tablet po once. May repeat dose in 3 days as needed for persistent symptoms. (Patient not taking: Reported on 12/20/2018)  . sulfamethoxazole-trimethoprim (BACTRIM DS,SEPTRA DS) 800-160 MG tablet Take 1 tablet by mouth 2 (two) times daily.  . [DISCONTINUED] ciprofloxacin (CIPRO) 500 MG tablet Take 1 tablet (500 mg total) by mouth 2 (two) times daily. (Patient not taking: Reported on 12/20/2018)   No facility-administered encounter medications on file as of 12/20/2018.       Medical History: History reviewed. No pertinent past medical  history.   Today's Vitals   12/20/18 1031  BP: 133/83  Pulse: 84  Resp: 16  SpO2: 100%  Weight: 244 lb 12.8 oz (111 kg)  Height: 5\' 4"  (1.626 m)    Review of Systems  Constitutional: Positive for appetite change and fatigue. Negative for chills and unexpected weight change.       Decreased appetite  HENT: Negative for congestion, postnasal drip, rhinorrhea, sneezing and sore throat.   Respiratory: Negative for cough, chest tightness, shortness of breath and wheezing.   Cardiovascular: Negative for chest pain and palpitations.  Gastrointestinal: Positive for nausea. Negative for abdominal pain, constipation and diarrhea.       Lower abdominal discomfort .  Endocrine: Negative for cold intolerance, heat intolerance, polydipsia and polyuria.  Genitourinary: Positive for flank pain, hematuria, pelvic pain and vaginal discharge. Negative for dysuria and frequency.  Musculoskeletal: Positive for back pain. Negative for arthralgias, joint swelling and neck pain.  Skin: Negative for rash.  Allergic/Immunologic: Negative for environmental allergies.  Neurological: Positive for headaches. Negative for tremors and numbness.  Hematological: Negative for adenopathy. Does not bruise/bleed easily.  Psychiatric/Behavioral: Negative for behavioral problems (Depression), dysphoric mood, sleep disturbance and suicidal ideas. The patient is not nervous/anxious.     Physical Exam Vitals signs and nursing note reviewed.  Constitutional:      General: She is not in acute distress.    Appearance: She is well-developed. She is obese. She is not diaphoretic.  HENT:     Head: Normocephalic and  atraumatic.     Mouth/Throat:     Pharynx: No oropharyngeal exudate.  Eyes:     Pupils: Pupils are equal, round, and reactive to light.  Neck:     Musculoskeletal: Normal range of motion and neck supple.     Thyroid: No thyromegaly.     Vascular: No JVD.     Trachea: No tracheal deviation.  Cardiovascular:      Rate and Rhythm: Normal rate and regular rhythm.     Heart sounds: Normal heart sounds. No murmur. No friction rub. No gallop.   Pulmonary:     Effort: Pulmonary effort is normal. No respiratory distress.     Breath sounds: Normal breath sounds. No wheezing or rales.  Chest:     Chest wall: No tenderness.  Abdominal:     General: Bowel sounds are normal.     Palpations: Abdomen is soft.     Tenderness: There is abdominal tenderness.  Genitourinary:    Comments: Urine sample positive for protein and large amount of blood. She denies being on menstrual cycle at this time.  Musculoskeletal: Normal range of motion.  Lymphadenopathy:     Cervical: No cervical adenopathy.  Skin:    General: Skin is warm and dry.  Neurological:     Mental Status: She is alert and oriented to person, place, and time.     Cranial Nerves: No cranial nerve deficit.  Psychiatric:        Behavior: Behavior normal.        Thought Content: Thought content normal.        Judgment: Judgment normal.    Assessment/Plan: 1. Urinary tract infection with hematuria, site unspecified Start bactrim DS bid for 10 days. Send urine for culture and sensitivity and adjust antibiotics as indicated  - sulfamethoxazole-trimethoprim (BACTRIM DS,SEPTRA DS) 800-160 MG tablet; Take 1 tablet by mouth 2 (two) times daily.  Dispense: 20 tablet; Refill: 0  2. Bacterial vaginitis Start flagyl vaginal gel. Use one applicatorful every night for 7 nights. Reassess at next visit.  - metroNIDAZOLE (METROGEL) 0.75 % vaginal gel; Use 1 applicator vaginally every night for 7 nights  Dispense: 70 g; Refill: 1  3. Dysuria - POCT Urinalysis Dipstick - CULTURE, URINE COMPREHENSIVE  General Counseling: Chanti verbalizes understanding of the findings of todays visit and agrees with plan of treatment. I have discussed any further diagnostic evaluation that may be needed or ordered today. We also reviewed her medications today. she has been  encouraged to call the office with any questions or concerns that should arise related to todays visit.    Counseling:  This patient was seen by Vincent Gros FNP Collaboration with Dr [name] as a part of collaborative care agreement  Orders Placed This Encounter  Procedures  . CULTURE, URINE COMPREHENSIVE  . POCT Urinalysis Dipstick    Meds ordered this encounter  Medications  . sulfamethoxazole-trimethoprim (BACTRIM DS,SEPTRA DS) 800-160 MG tablet    Sig: Take 1 tablet by mouth 2 (two) times daily.    Dispense:  20 tablet    Refill:  0    Order Specific Question:   Supervising Provider    Answer:   [name] [[id number]]  . metroNIDAZOLE (METROGEL) 0.75 % vaginal gel    Sig: Use 1 applicator vaginally every night for 7 nights    Dispense:  70 g    Refill:  1    Order Specific Question:   Supervising Provider    Answer:  Beverely RisenKHAN, FOZIA M [[id number]]    Time spent: 25 Minutes

## 2018-12-23 LAB — CULTURE, URINE COMPREHENSIVE

## 2019-01-03 ENCOUNTER — Ambulatory Visit: Payer: Self-pay | Admitting: Nurse Practitioner

## 2019-01-05 ENCOUNTER — Other Ambulatory Visit: Payer: Self-pay

## 2019-01-05 ENCOUNTER — Encounter: Payer: Self-pay | Admitting: Emergency Medicine

## 2019-01-05 ENCOUNTER — Emergency Department
Admission: EM | Admit: 2019-01-05 | Discharge: 2019-01-05 | Disposition: A | Discharge status: Home / Self Care | Payer: BLUE CROSS/BLUE SHIELD | Attending: Emergency Medicine | Admitting: Emergency Medicine

## 2019-01-05 DIAGNOSIS — F17210 Nicotine dependence, cigarettes, uncomplicated: Secondary | ICD-10-CM | POA: Insufficient documentation

## 2019-01-05 DIAGNOSIS — R519 Headache, unspecified: Secondary | ICD-10-CM

## 2019-01-05 DIAGNOSIS — R42 Dizziness and giddiness: Secondary | ICD-10-CM

## 2019-01-05 DIAGNOSIS — R51 Headache: Secondary | ICD-10-CM | POA: Insufficient documentation

## 2019-01-05 DIAGNOSIS — N3000 Acute cystitis without hematuria: Secondary | ICD-10-CM | POA: Diagnosis not present

## 2019-01-05 LAB — CBC
HCT: 37 % (ref 36.0–46.0)
Hemoglobin: 12 g/dL (ref 12.0–15.0)
MCH: 29.3 pg (ref 26.0–34.0)
MCHC: 32.4 g/dL (ref 30.0–36.0)
MCV: 90.2 fL (ref 80.0–100.0)
Platelets: 324 10*3/uL (ref 150–400)
RBC: 4.1 MIL/uL (ref 3.87–5.11)
RDW: 12.2 % (ref 11.5–15.5)
WBC: 8.6 10*3/uL (ref 4.0–10.5)
nRBC: 0 % (ref 0.0–0.2)

## 2019-01-05 LAB — POCT PREGNANCY, URINE: Preg Test, Ur: NEGATIVE

## 2019-01-05 LAB — URINALYSIS, COMPLETE (UACMP) WITH MICROSCOPIC
Bilirubin Urine: NEGATIVE
GLUCOSE, UA: NEGATIVE mg/dL
Ketones, ur: NEGATIVE mg/dL
Nitrite: NEGATIVE
PROTEIN: NEGATIVE mg/dL
RBC / HPF: >50 RBC/hpf — ABNORMAL HIGH (ref 0–5)
Specific Gravity, Urine: 1.027 (ref 1.005–1.030)
pH: 6 (ref 5.0–8.0)

## 2019-01-05 LAB — BASIC METABOLIC PANEL
Anion gap: 6 (ref 5–15)
BUN: 12 mg/dL (ref 6–20)
CHLORIDE: 109 mmol/L (ref 98–111)
CO2: 26 mmol/L (ref 22–32)
Calcium: 8.6 mg/dL — ABNORMAL LOW (ref 8.9–10.3)
Creatinine, Ser: 0.96 mg/dL (ref 0.44–1.00)
GFR calc Af Amer: >60 mL/min (ref >60)
GFR calc non Af Amer: >60 mL/min (ref >60)
Glucose, Bld: 95 mg/dL (ref 70–99)
Potassium: 3.8 mmol/L (ref 3.5–5.1)
SODIUM: 141 mmol/L (ref 135–145)

## 2019-01-05 MED ORDER — SODIUM CHLORIDE 0.9 % IV BOLUS
1000.0000 mL | Freq: Once | INTRAVENOUS | Status: AC
  Administered 2019-01-05: 1000 mL via INTRAVENOUS

## 2019-01-05 MED ORDER — SULFAMETHOXAZOLE-TRIMETHOPRIM 800-160 MG PO TABS: 1.0000 | ORAL_TABLET | Freq: Two times a day (BID) | ORAL | 0 refills | Status: AC

## 2019-01-05 MED ORDER — SODIUM CHLORIDE 0.9 % IV SOLN
1.0000 g | Freq: Once | INTRAVENOUS | Status: AC
  Administered 2019-01-05: 1 g via INTRAVENOUS
  Filled 2019-01-05: qty 10

## 2019-01-05 MED ORDER — KETOROLAC TROMETHAMINE 30 MG/ML IJ SOLN
30.0000 mg | Freq: Once | INTRAMUSCULAR | Status: AC
  Administered 2019-01-05: 30 mg via INTRAVENOUS
  Filled 2019-01-05: qty 1

## 2019-01-05 NOTE — Discharge Instructions (Addendum)
Patient plenty of fluid to stay well-hydrated.  Please take the entire course of antibiotics, even if you are feeling better.  Take Tylenol or Motrin for pain or fever.  Return to the emergency department if you develop severe pain, lightheadedness or fainting, inability to keep down fluids, or any other symptoms concerning to you.

## 2019-01-05 NOTE — ED Notes (Signed)
Reports feeling weakness to b/l lower extremities yesterday. Denies n/v/f/c/d. awaitingh md plan of care. Call light with in reach, bed low. Family at bedside.

## 2019-01-05 NOTE — ED Triage Notes (Signed)
Feeling weak, headache x 3 days, dizzy. States anemic but doesn't take iron pills at this time

## 2019-01-05 NOTE — ED Provider Notes (Signed)
[facility] Emergency Department Provider Note  ____________________________________________  Time seen: Approximately 6:57 PM  I have reviewed the triage vital signs and the nursing notes.   HISTORY  Chief Complaint Weakness    HPI Marilyn Li is a 33 y.o. female with a history of UTI presenting with general malaise and lightheadedness, shortness of breath and headache.  The patient reports that after taking a bath yesterday, she was lightheaded and felt dizzy.  She went to sit on her bed and felt short of breath but this resolved after several minutes.  She also reports that for the past 3 days, she has had a mild frontal headache that is not responsive to Tylenol or Motrin.  She denies any stiff neck, rash, tick bites, or trauma.  No numbness weakness or tingling, visual changes or speech changes.  She has not had any nausea vomiting or diarrhea, abdominal pain, dysuria or hematuria.  No fevers or chills.  No cough or cold symptoms  History reviewed. No pertinent past medical history.  Patient Active Problem List   Diagnosis Date Noted  . Other fatigue 07/27/2018  . Current moderate episode of major depressive disorder without prior episode (HCC) 07/27/2018  . Generalized anxiety disorder 07/27/2018  . Bacterial vaginitis 05/27/2018  . Candidiasis 05/27/2018  . Urinary tract infection with hematuria 05/27/2018  . Dysuria 05/27/2018  . Encounter for well adult exam with abnormal findings 11/16/2017    Past Surgical History:  Procedure Laterality Date  . TONSILLECTOMY      Current Outpatient Rx  . Order #: [id number] Class: Normal  . Order #: [id number] Class: Print  . Order #: [id number] Class: Normal  . Order #: [id number] Class: Normal  . Order #: [id number] Class: Normal  . Order #: [id number] Class: Print    Allergies Patient has no known allergies.  Family History  Problem Relation Age of Onset  . Diabetes Mellitus II Mother   . COPD Mother    . Arthritis Mother   . Stroke Father   . Hypertension Father     Social History Social History   Tobacco Use  . Smoking status: Current Every Day Smoker    Types: Cigarettes  . Smokeless tobacco: Never Used  Substance Use Topics  . Alcohol use: Yes    Comment: occasionally/  . Drug use: No    Review of Systems Constitutional: No fever/chills.  As of lightheadedness and dizziness without syncope. Eyes: No visual changes. ENT: No sore throat. No congestion or rhinorrhea. Cardiovascular: Denies chest pain. Denies palpitations. Respiratory: Acid shortness of breath.  No cough. Gastrointestinal: No abdominal pain.  No nausea, no vomiting.  No diarrhea.  No constipation. Genitourinary: Negative for dysuria.  Urinary frequency.  No hematuria. Musculoskeletal: Negative for back pain. Skin: Negative for rash. Neurological: Positive for mild headaches. No focal numbness, tingling or weakness.     ____________________________________________   PHYSICAL EXAM:  VITAL SIGNS: ED Triage Vitals  Enc Vitals Group     BP 01/05/19 1549 128/68     Pulse Rate 01/05/19 1549 83     Resp 01/05/19 1549 16     Temp 01/05/19 1549 98.8 F (37.1 C)     Temp src --      SpO2 01/05/19 1549 100 %     Weight 01/05/19 1550 242 lb (109.8 kg)     Height 01/05/19 1550 5\' 4"  (1.626 m)     Head Circumference --      Peak Flow --  Pain Score 01/05/19 1550 4     Pain Loc --      Pain Edu? --      Excl. in GC? --     Constitutional: Alert and oriented. Answers questions appropriately. Eyes: Conjunctivae are normal.  EOMI. No scleral icterus. Head: Atraumatic. Nose: No congestion/rhinnorhea. Mouth/Throat: Mucous membranes are moist.  Neck: No stridor.  Supple.  No JVD.  No meningismus. Cardiovascular: Normal rate, regular rhythm. No murmurs, rubs or gallops.  Respiratory: Normal respiratory effort.  No accessory muscle use or retractions. Lungs CTAB.  No wheezes, rales or  ronchi. Gastrointestinal: Soft, and nondistended.  Mild tenderness to palpation in the suprapubic region.  No guarding or rebound.  No peritoneal signs. Musculoskeletal: No LE edema. No ttp in the calves or palpable cords.  Negative Homan's sign. Neurologic:  A&Ox3.  Speech is clear.  Face and smile are symmetric.  EOMI.  Moves all extremities well. Skin:  Skin is warm, dry and intact. No rash noted. Psychiatric: Mood and affect are normal. Speech and behavior are normal.  Normal judgement.  ____________________________________________   LABS (all labs ordered are listed, but only abnormal results are displayed)  Labs Reviewed  BASIC METABOLIC PANEL - Abnormal; Notable for the following components:      Result Value   Calcium 8.6 (*)    All other components within normal limits  URINALYSIS, COMPLETE (UACMP) WITH MICROSCOPIC - Abnormal; Notable for the following components:   Color, Urine YELLOW (*)    APPearance CLEAR (*)    Hgb urine dipstick LARGE (*)    Leukocytes, UA TRACE (*)    RBC / HPF >50 (*)    Bacteria, UA RARE (*)    All other components within normal limits  CBC  CBG MONITORING, ED  POC URINE PREG, ED  POCT PREGNANCY, URINE   ____________________________________________  EKG  ED ECG REPORT I, Anne-Caroline Sharma CovertNorman, the attending physician, personally viewed and interpreted this ECG.   Date: 01/05/2019  EKG Time: 1603  Rate: 79  Rhythm: normal sinus rhythm  Axis: normal  Intervals:none  ST&T Change: No STEMI; evidence of Brugada syndrome, prolonged QTC or hypertrophy.  ____________________________________________  RADIOLOGY  No results found.  ____________________________________________   PROCEDURES  Procedure(s) performed: None  Procedures  Critical Care performed: No ____________________________________________   INITIAL IMPRESSION / ASSESSMENT AND PLAN / ED COURSE  Pertinent labs & imaging results that were available during my care of  the patient were reviewed by me and considered in my medical decision making (see chart for details).  33 y.o. female, with a history of UTI, presenting with lightheadedness and dizziness, a brief episode of shortness of breath that now resolved, and mild headache.  On my exam she suprapubic tenderness.  The patient is hemodynamically stable and afebrile.  Her laboratory studies are consistent with a urinary tract infection I have ordered Rocephin and intravenous fluids for her.  I do not see any evidence that would be suggestive of pyelonephritis, or renal stone today.  She has no focal neurologic deficits which would be consistent with an acute intracranial emergency causing her headache.  The patient is afebrile and has a normal white blood cell count.  She is eating and drinking normally.  At this time, the patient is safe for discharge home.  Follow-up instructions as well as return precautions were discussed.  ____________________________________________  FINAL CLINICAL IMPRESSION(S) / ED DIAGNOSES  Final diagnoses:  Acute cystitis without hematuria  Lightheadedness  Acute nonintractable headache, unspecified  headache type         NEW MEDICATIONS STARTED DURING THIS VISIT:  New Prescriptions   SULFAMETHOXAZOLE-TRIMETHOPRIM (BACTRIM DS,SEPTRA DS) 800-160 MG TABLET    Take 1 tablet by mouth 2 (two) times daily for 5 days.      Rockne Menghini, MD 01/05/19 [phone]

## 2019-02-17 ENCOUNTER — Encounter: Payer: Self-pay | Admitting: Adult Health

## 2019-02-17 ENCOUNTER — Other Ambulatory Visit: Payer: Self-pay

## 2019-02-17 ENCOUNTER — Ambulatory Visit (INDEPENDENT_AMBULATORY_CARE_PROVIDER_SITE_OTHER): Payer: BLUE CROSS/BLUE SHIELD | Admitting: Adult Health

## 2019-02-17 VITALS — BP 118/74 | HR 94 | Temp 98.4°F | Resp 16 | Ht 64.0 in | Wt 237.0 lb

## 2019-02-17 DIAGNOSIS — J029 Acute pharyngitis, unspecified: Secondary | ICD-10-CM

## 2019-02-17 DIAGNOSIS — J028 Acute pharyngitis due to other specified organisms: Secondary | ICD-10-CM

## 2019-02-17 DIAGNOSIS — F411 Generalized anxiety disorder: Secondary | ICD-10-CM | POA: Diagnosis not present

## 2019-02-17 DIAGNOSIS — B9689 Other specified bacterial agents as the cause of diseases classified elsewhere: Secondary | ICD-10-CM | POA: Diagnosis not present

## 2019-02-17 LAB — POCT RAPID STREP A (OFFICE): Rapid Strep A Screen: NEGATIVE

## 2019-02-17 LAB — POCT INFLUENZA A/B
INFLUENZA B, POC: NEGATIVE
Influenza A, POC: NEGATIVE

## 2019-02-17 MED ORDER — PENICILLIN V POTASSIUM 500 MG PO TABS: 500.0000 mg | ORAL_TABLET | Freq: Two times a day (BID) | ORAL | 0 refills | Status: DC

## 2019-02-17 NOTE — Progress Notes (Signed)
[facility] [address] [place], [place] [address]  Internal MEDICINE  Office Visit Note  Patient Name: Marilyn Li  [id number]  [id number]  Date of Service: 02/28/2019  Chief Complaint  Patient presents with  . Sore Throat    tingling in fingers      HPI Pt is here for a sick visit. Pt reports sore throat that began this morning. She reports he was coughing up some mucous this morning as well. She denies fever. Both of her kids had flu two weeks ago, but she managed to stay healthy. No chest pain, sob, fever or other symptoms at this time.      Current Medication:  Outpatient Encounter Medications as of 02/17/2019  Medication Sig  . busPIRone (BUSPAR) 10 MG tablet Take 1/2 to 1 tablet po BID prn anxiety  . JUNEL FE 1/20 1-20 MG-MCG tablet Take 1 tab po as directed  . penicillin v potassium (VEETID) 500 MG tablet Take 1 tablet (500 mg total) by mouth 2 (two) times daily.  . [DISCONTINUED] cyclobenzaprine (FLEXERIL) 10 MG tablet Take 1 tablet (10 mg total) by mouth 2 (two) times daily as needed for muscle spasms. (Patient not taking: Reported on 05/27/2018)  . [DISCONTINUED] fluconazole (DIFLUCAN) 150 MG tablet Take 1 tablet po once. May repeat dose in 3 days as needed for persistent symptoms. (Patient not taking: Reported on 12/20/2018)  . [DISCONTINUED] metroNIDAZOLE (METROGEL) 0.75 % vaginal gel Use 1 applicator vaginally every night for 7 nights (Patient not taking: Reported on 02/17/2019)   No facility-administered encounter medications on file as of 02/17/2019.       Medical History: No past medical history on file.   Vital Signs: BP 118/74   Pulse 94   Temp 98.4 F (36.9 C)   Resp 16   Ht 5\' 4"  (1.626 m)   Wt 237 lb (107.5 kg)   SpO2 96%   BMI 40.68 kg/m    Review of Systems  Constitutional: Negative for chills, fatigue and unexpected weight change.  HENT: Positive for sore throat. Negative for congestion, rhinorrhea and sneezing.   Eyes:  Negative for photophobia, pain and redness.  Respiratory: Negative for cough, chest tightness and shortness of breath.   Cardiovascular: Negative for chest pain and palpitations.  Gastrointestinal: Negative for abdominal pain, constipation, diarrhea, nausea and vomiting.  Endocrine: Negative.   Genitourinary: Negative for dysuria and frequency.  Musculoskeletal: Negative for arthralgias, back pain, joint swelling and neck pain.  Skin: Negative for rash.  Allergic/Immunologic: Negative.   Neurological: Negative for tremors and numbness.  Hematological: Negative for adenopathy. Does not bruise/bleed easily.  Psychiatric/Behavioral: Negative for behavioral problems and sleep disturbance. The patient is not nervous/anxious.     Physical Exam Vitals signs and nursing note reviewed.  Constitutional:      General: She is not in acute distress.    Appearance: She is well-developed. She is not diaphoretic.  HENT:     Head: Normocephalic and atraumatic.     Mouth/Throat:     Pharynx: No oropharyngeal exudate.     Tonsils: Tonsillar exudate present. 1+ on the right. 1+ on the left.  Eyes:     Pupils: Pupils are equal, round, and reactive to light.  Neck:     Musculoskeletal: Normal range of motion and neck supple.     Thyroid: No thyromegaly.     Vascular: No JVD.     Trachea: No tracheal deviation.  Cardiovascular:     Rate and Rhythm:  Normal rate and regular rhythm.     Heart sounds: Normal heart sounds. No murmur. No friction rub. No gallop.   Pulmonary:     Effort: Pulmonary effort is normal. No respiratory distress.     Breath sounds: Normal breath sounds. No wheezing or rales.  Chest:     Chest wall: No tenderness.  Abdominal:     Palpations: Abdomen is soft.     Tenderness: There is no abdominal tenderness. There is no guarding.  Musculoskeletal: Normal range of motion.  Lymphadenopathy:     Cervical: No cervical adenopathy.  Skin:    General: Skin is warm and dry.   Neurological:     Mental Status: She is alert and oriented to person, place, and time.     Cranial Nerves: No cranial nerve deficit.  Psychiatric:        Behavior: Behavior normal.        Thought Content: Thought content normal.        Judgment: Judgment normal.       Assessment/Plan: 1. Acute bacterial pharyngitis Will empirically treat patient due to history,Advised patient to take entire course of antibiotics as prescribed with food. Pt should return to clinic in 7-10 days if symptoms fail to improve or new symptoms develop.  - penicillin v potassium (VEETID) 500 MG tablet; Take 1 tablet (500 mg total) by mouth 2 (two) times daily.  Dispense: 20 tablet; Refill: 0  2. Sore throat Patient's flu test is negative as well as her rapid strep test.  We discussed that the strep test is not 100% accurate and that if her symptoms have not been going on for more than 24 hours she may not test positive we will get a false negative.  We will treat patient's sore throat as she appears infected clinically. - POCT rapid strep A - POCT Influenza A/B  3. Generalized anxiety disorder Stable, continue current medications.  General Counseling: Marilyn Li verbalizes understanding of the findings of todays visit and agrees with plan of treatment. I have discussed any further diagnostic evaluation that may be needed or ordered today. We also reviewed her medications today. she has been encouraged to call the office with any questions or concerns that should arise related to todays visit.   Orders Placed This Encounter  Procedures  . POCT rapid strep A  . POCT Influenza A/B    Meds ordered this encounter  Medications  . penicillin v potassium (VEETID) 500 MG tablet    Sig: Take 1 tablet (500 mg total) by mouth 2 (two) times daily.    Dispense:  20 tablet    Refill:  0    Time spent: 25 Minutes  This patient was seen by Blima Ledger AGNP-C in Collaboration with Dr [name] as a part of  collaborative care agreement.  Johnna Acosta AGNP-C Internal Medicine

## 2019-02-21 ENCOUNTER — Ambulatory Visit: Payer: Self-pay | Admitting: Internal Medicine

## 2019-02-28 ENCOUNTER — Encounter: Payer: Self-pay | Admitting: Adult Health

## 2019-03-24 ENCOUNTER — Ambulatory Visit: Payer: Self-pay | Admitting: Internal Medicine

## 2019-05-25 ENCOUNTER — Ambulatory Visit (INDEPENDENT_AMBULATORY_CARE_PROVIDER_SITE_OTHER): Payer: BLUE CROSS/BLUE SHIELD | Admitting: Adult Health

## 2019-05-25 ENCOUNTER — Encounter: Payer: Self-pay | Admitting: Adult Health

## 2019-05-25 ENCOUNTER — Other Ambulatory Visit: Payer: Self-pay

## 2019-05-25 VITALS — BP 137/85 | HR 73 | Temp 97.7°F | Resp 16 | Ht 64.0 in | Wt 241.4 lb

## 2019-05-25 DIAGNOSIS — B373 Candidiasis of vulva and vagina: Secondary | ICD-10-CM

## 2019-05-25 DIAGNOSIS — B3731 Acute candidiasis of vulva and vagina: Secondary | ICD-10-CM

## 2019-05-25 MED ORDER — METRONIDAZOLE 1.3 % VA GEL: 1.0000 "application " | Freq: Every day | VAGINAL | 0 refills | Status: DC

## 2019-05-25 MED ORDER — METRONIDAZOLE 0.75 % EX GEL: 1.0000 "application " | Freq: Every day | CUTANEOUS | 0 refills | Status: DC

## 2019-05-25 MED ORDER — FLUCONAZOLE 150 MG PO TABS: 150.0000 mg | ORAL_TABLET | ORAL | 0 refills | Status: DC | PRN

## 2019-05-25 NOTE — Progress Notes (Signed)
[facility] [address] [place], [place] [address]  Internal MEDICINE  Office Visit Note  Patient Name: Marilyn GrahamLatasha M Li  [id number]  [id number]  Date of Service: 05/25/2019  Chief Complaint  Patient presents with  . Vaginal Discharge    Pt been having symptoms for 3 days, dicharge and odor started first.  . Vaginal Itching     HPI Pt is here for a sick visit. Pt reports 3 days of vaginal itching, odor and discharge.  She has a history of BV as well as yeast infections in the past.  She reports it feels just like previous infections. Denies fever or other symptoms.      Current Medication:  Outpatient Encounter Medications as of 05/25/2019  Medication Sig  . busPIRone (BUSPAR) 10 MG tablet Take 1/2 to 1 tablet po BID prn anxiety  . fluconazole (DIFLUCAN) 150 MG tablet Take 1 tablet (150 mg total) by mouth every three (3) days as needed.  Colleen Can. JUNEL FE 1/20 1-20 MG-MCG tablet Take 1 tab po as directed  . metroNIDAZOLE 1.3 % GEL Place 1 application vaginally at bedtime.  . penicillin v potassium (VEETID) 500 MG tablet Take 1 tablet (500 mg total) by mouth 2 (two) times daily.   No facility-administered encounter medications on file as of 05/25/2019.       Medical History: History reviewed. No pertinent past medical history.   Vital Signs: BP 137/85 (BP Location: Right Arm, Patient Position: Sitting, Cuff Size: Large)   Pulse 73   Temp 97.7 F (36.5 C)   Resp 16   Ht 5\' 4"  (1.626 m)   Wt 241 lb 6.4 oz (109.5 kg)   SpO2 100%   BMI 41.44 kg/m    Review of Systems  Constitutional: Negative for chills, fatigue and unexpected weight change.  HENT: Negative for congestion, rhinorrhea, sneezing and sore throat.   Eyes: Negative for photophobia, pain and redness.  Respiratory: Negative for cough, chest tightness and shortness of breath.   Cardiovascular: Negative for chest pain and palpitations.  Gastrointestinal: Negative for abdominal pain,  constipation, diarrhea, nausea and vomiting.  Endocrine: Negative.   Genitourinary: Positive for dyspareunia. Negative for dysuria and frequency.       Vaginal itching, discharge  Musculoskeletal: Negative for arthralgias, back pain, joint swelling and neck pain.  Skin: Negative for rash.  Allergic/Immunologic: Negative.   Neurological: Negative for tremors and numbness.  Hematological: Negative for adenopathy. Does not bruise/bleed easily.  Psychiatric/Behavioral: Negative for behavioral problems and sleep disturbance. The patient is not nervous/anxious.     Physical Exam Vitals signs and nursing note reviewed.  Constitutional:      General: She is not in acute distress.    Appearance: She is well-developed. She is not diaphoretic.  HENT:     Head: Normocephalic and atraumatic.     Mouth/Throat:     Pharynx: No oropharyngeal exudate.  Eyes:     Pupils: Pupils are equal, round, and reactive to light.  Neck:     Musculoskeletal: Normal range of motion and neck supple.     Thyroid: No thyromegaly.     Vascular: No JVD.     Trachea: No tracheal deviation.  Cardiovascular:     Rate and Rhythm: Normal rate and regular rhythm.     Heart sounds: Normal heart sounds. No murmur. No friction rub. No gallop.   Pulmonary:     Effort: Pulmonary effort is normal. No respiratory distress.     Breath sounds: Normal  breath sounds. No wheezing or rales.  Chest:     Chest wall: No tenderness.  Abdominal:     Palpations: Abdomen is soft.     Tenderness: There is no abdominal tenderness. There is no guarding.  Musculoskeletal: Normal range of motion.  Lymphadenopathy:     Cervical: No cervical adenopathy.  Skin:    General: Skin is warm and dry.  Neurological:     Mental Status: She is alert and oriented to person, place, and time.     Cranial Nerves: No cranial nerve deficit.  Psychiatric:        Behavior: Behavior normal.        Thought Content: Thought content normal.        Judgment:  Judgment normal.       Assessment/Plan: 1. Vaginal candidiasis Take diflucan as discussed and use flagly vaginal gel as ordered.  Follow up with obgyn as planned.  - fluconazole (DIFLUCAN) 150 MG tablet; Take 1 tablet (150 mg total) by mouth every three (3) days as needed.  Dispense: 3 tablet; Refill: 0 - metroNIDAZOLE 1.3 % GEL; Place 1 application vaginally at bedtime.  Dispense: 5 g; Refill: 0  General Counseling: Kindred verbalizes understanding of the findings of todays visit and agrees with plan of treatment. I have discussed any further diagnostic evaluation that may be needed or ordered today. We also reviewed her medications today. she has been encouraged to call the office with any questions or concerns that should arise related to todays visit.   No orders of the defined types were placed in this encounter.   Meds ordered this encounter  Medications  . fluconazole (DIFLUCAN) 150 MG tablet    Sig: Take 1 tablet (150 mg total) by mouth every three (3) days as needed.    Dispense:  3 tablet    Refill:  0  . metroNIDAZOLE 1.3 % GEL    Sig: Place 1 application vaginally at bedtime.    Dispense:  5 g    Refill:  0    Time spent: 15 Minutes  This patient was seen by Orson Gear AGNP-C in Collaboration with Dr Lavera Guise as a part of collaborative care agreement.  Kendell Bane AGNP-C Internal Medicine

## 2019-05-25 NOTE — Patient Instructions (Signed)
Vaginal Yeast infection, Adult    Vaginal yeast infection is a condition that causes vaginal discharge as well as soreness, swelling, and redness (inflammation) of the vagina. This is a common condition. Some women get this infection frequently.  What are the causes?  This condition is caused by a change in the normal balance of the yeast (candida) and bacteria that live in the vagina. This change causes an overgrowth of yeast, which causes the inflammation.  What increases the risk?  The condition is more likely to develop in women who:   Take antibiotic medicines.   Have diabetes.   Take birth control pills.   Are pregnant.   Douche often.   Have a weak body defense system (immune system).   Have been taking steroid medicines for a long time.   Frequently wear tight clothing.  What are the signs or symptoms?  Symptoms of this condition include:   White, thick, creamy vaginal discharge.   Swelling, itching, redness, and irritation of the vagina. The lips of the vagina (vulva) may be affected as well.   Pain or a burning feeling while urinating.   Pain during sex.  How is this diagnosed?  This condition is diagnosed based on:   Your medical history.   A physical exam.   A pelvic exam. Your health care provider will examine a sample of your vaginal discharge under a microscope. Your health care provider may send this sample for testing to confirm the diagnosis.  How is this treated?  This condition is treated with medicine. Medicines may be over-the-counter or prescription. You may be told to use one or more of the following:   Medicine that is taken by mouth (orally).   Medicine that is applied as a cream (topically).   Medicine that is inserted directly into the vagina (suppository).  Follow these instructions at home:    Lifestyle   Do not have sex until your health care provider approves. Tell your sex partner that you have a yeast infection. That person should go to his or her health care  provider and ask if they should also be treated.   Do not wear tight clothes, such as pantyhose or tight pants.   Wear breathable cotton underwear.  General instructions   Take or apply over-the-counter and prescription medicines only as told by your health care provider.   Eat more yogurt. This may help to keep your yeast infection from returning.   Do not use tampons until your health care provider approves.   Try taking a sitz bath to help with discomfort. This is a warm water bath that is taken while you are sitting down. The water should only come up to your hips and should cover your buttocks. Do this 3-4 times per day or as told by your health care provider.   Do not douche.   If you have diabetes, keep your blood sugar levels under control.   Keep all follow-up visits as told by your health care provider. This is important.  Contact a health care provider if:   You have a fever.   Your symptoms go away and then return.   Your symptoms do not get better with treatment.   Your symptoms get worse.   You have new symptoms.   You develop blisters in or around your vagina.   You have blood coming from your vagina and it is not your menstrual period.   You develop pain in your abdomen.  Summary     Vaginal yeast infection is a condition that causes discharge as well as soreness, swelling, and redness (inflammation) of the vagina.   This condition is treated with medicine. Medicines may be over-the-counter or prescription.   Take or apply over-the-counter and prescription medicines only as told by your health care provider.   Do not douche. Do not have sex or use tampons until your health care provider approves.   Contact a health care provider if your symptoms do not get better with treatment or your symptoms go away and then return.  This information is not intended to replace advice given to you by your health care provider. Make sure you discuss any questions you have with your health care  provider.  Document Released: 08/26/2005 Document Revised: 04/04/2018 Document Reviewed: 04/04/2018  Elsevier Interactive Patient Education  2019 [employer]vier Inc[employer]

## 2019-05-26 ENCOUNTER — Other Ambulatory Visit: Payer: Self-pay

## 2019-05-26 ENCOUNTER — Encounter: Payer: Self-pay | Admitting: Nurse Practitioner

## 2019-05-26 DIAGNOSIS — B9689 Other specified bacterial agents as the cause of diseases classified elsewhere: Secondary | ICD-10-CM

## 2019-05-26 DIAGNOSIS — N760 Acute vaginitis: Secondary | ICD-10-CM

## 2019-05-26 MED ORDER — METRONIDAZOLE 0.75 % VA GEL: VAGINAL | 1 refills | Status: DC

## 2019-06-27 NOTE — Progress Notes (Signed)
Gynecology Annual Exam  PCP: Lyndon CodeKhan, Fozia M, MD  Chief Complaint:  Chief Complaint  Patient presents with  . Gynecologic Exam    Vaginal discharge, itching and burning with slight odor about x1 week, hurting with urination and lower back     History of Present Illness:Marilyn Li is a 33 year old  female, G4 P3013, whose LMP was 06/09/2019. She presents today for her routine gyn exam. She is also concerned that she has a vaginitis and a possible UTI. One to two weeks ago she noticed lumbar sacral back pain and left flank pain, urinary frequency and voiding in small amounts. In addition she has noticed an odor "down there", a white vaginal discharge and some vulvovaginal itching. She has a history of recurrent monilial vulvovaginitis and bacterial vaginosis. She was last treated for BV last month.    She is a 33 year old  female, G4 23P3013, whose LMP was 06/09/2019. Her menses are regular. They occur every 1 month, they last 3-5 days, and are medium flow. She has not had any intermenstrual spotting. She reports occasional mild dysmenorrhea on first day of menses. Her menses have gotten lighter and less painful on the OCPs.  The patient's past medical history is remarkable for an ectopic pregnancy, anxiety/depression, tobacco use and obesity.  Since her last annual GYN exam dated 08/14/2014, she has had no significant changes in her health history. She has a hx of chronic pelvic pain, usually with sex in the lower abdomen.  She has seen Dr. Janene HarveyKlett who recommended a dx lap, but she declined the surgery. Her last pelvic ultrasound was in 2015 and was normal. She is sexually active. She uses OCPs for contraception.  She has a history of chlamydia in the past.  Her most recent pap smear was obtained 08/14/2014 and was NIL/negative.  Mammogram is not applicable. There is no family history of breast cancer. There is no family history of ovarian cancer. The patient does do monthly self breast  exams. She has occasional breast tenderness.    The patient smokes 1/2 ppd. She has quit for 2 years in the past cold [place]. Is thinking about quitting  The patient does not drink alcohol.  The patient does not use illegal drugs.  The patient exercises regularly. She has a BMI of 39.8 kg/m2 (06/28/2019).  The patient may get adequate calcium in her diet.   Review of Systems: Review of Systems  Constitutional: Negative for chills, fever and weight loss.  HENT: Negative for congestion, sinus pain and sore throat.   Eyes: Negative for blurred vision and pain.  Respiratory: Negative for hemoptysis, shortness of breath and wheezing.   Cardiovascular: Negative for chest pain, palpitations and leg swelling.  Gastrointestinal: Negative for abdominal pain, blood in stool, diarrhea, heartburn, nausea and vomiting.  Genitourinary: Positive for frequency and urgency. Negative for dysuria and hematuria.       Positive for vaginal odor, vaginal discharge, voiding small amounts Painful for deep thrust dysparunea  Musculoskeletal: Positive for back pain (and left flank pain.). Negative for joint pain and myalgias.  Skin: Negative for itching and rash.  Neurological: Negative for dizziness, tingling and headaches.  Endo/Heme/Allergies: Negative for environmental allergies and polydipsia. Does not bruise/bleed easily.       Negative for hirsutism   Psychiatric/Behavioral: Negative for depression. The patient is not nervous/anxious and does not have insomnia.   Breasts: positive for breast tenderness  Past Medical History:  Past Medical  History:  Diagnosis Date  . Anxiety and depression   . Ectopic pregnancy   . Obesity   . Recurrent vaginitis     Past Surgical History:  Past Surgical History:  Procedure Laterality Date  . ADENOIDECTOMY  1986  . DILATION AND CURETTAGE OF UTERUS  2010  . ECTOPIC PREGNANCY SURGERY  2006   Dr Toya SmothersKnowles-Jonas  . TONSILLECTOMY  2007    Family History:  Family  History  Problem Relation Age of Onset  . Diabetes Mellitus II Mother   . COPD Mother   . Arthritis Mother   . Stroke Father   . Hypertension Father   . Brain cancer Father        meningioma-died April 2014  . Diabetes Father     Social History:  Social History   Socioeconomic History  . Marital status: Single    Spouse name: Not on file  . Number of children: Not on file  . Years of education: Not on file  . Highest education level: Not on file  Occupational History  . Not on file  Social Needs  . Financial resource strain: Not on file  . Food insecurity    Worry: Not on file    Inability: Not on file  . Transportation needs    Medical: Not on file    Non-medical: Not on file  Tobacco Use  . Smoking status: Current Every Day Smoker    Types: Cigarettes  . Smokeless tobacco: Never Used  Substance and Sexual Activity  . Alcohol use: Yes    Comment: occasionally/  . Drug use: No  . Sexual activity: Yes    Birth control/protection: Pill  Lifestyle  . Physical activity    Days per week: Not on file    Minutes per session: Not on file  . Stress: Not on file  Relationships  . Social Musicianconnections    Talks on phone: Not on file    Gets together: Not on file    Attends religious service: Not on file    Active member of club or organization: Not on file    Attends meetings of clubs or organizations: Not on file    Relationship status: Not on file  . Intimate partner violence    Fear of current or ex partner: Not on file    Emotionally abused: Not on file    Physically abused: Not on file    Forced sexual activity: Not on file  Other Topics Concern  . Not on file  Social History Narrative  . Not on file    Allergies:  No Known Allergies  Medications: Prior to Admission medications   Medication Sig Start Date End Date Taking? Authorizing Provider  busPIRone (BUSPAR) 10 MG tablet Take 1/2 to 1 tablet po BID prn anxiety 07/15/18   Carlean JewsBoscia, Heather E, NP          JUNEL FE 1/20 1-20 MG-MCG tablet Take 1 tab po as directed 07/11/18   Carlean JewsBoscia, Heather E, NP  Physical Exam Vitals: BP 120/70   Pulse 75   Ht 5\' 4"  (1.626 m)   Wt 232 lb (105.2 kg)   LMP 06/09/2019 (Exact Date)   BMI 39.82 kg/m   General: BF in NAD HEENT: normocephalic, anicteric Neck: no thyroid enlargement, no palpable nodules, no cervical lymphadenopathy  Pulmonary: No increased work of breathing, CTAB Cardiovascular: RRR, without murmur  Breast: Breast symmetrical, no tenderness, no palpable nodules or masses, no skin or nipple retraction present, no  nipple discharge.  No axillary, infraclavicular or supraclavicular lymphadenopathy. Abdomen: Soft, non-tender, non-distended.  Umbilicus without lesions.  No hepatomegaly or masses palpable. No evidence of hernia. Genitourinary:  External: Normal external female genitalia.  Normal urethral meatus, normal Bartholin's and Skene's glands.    Vagina: Normal vaginal mucosa, no evidence of prolapse, white mucoepithelial discharge.    Cervix: Grossly normal in appearance, no bleeding, non-tender  Uterus: Anteverted, normal size, shape, and consistency, mobile, and mild fundal tenderness  Adnexa: No adnexal masses, non-tender  Rectal: deferred  Lymphatic: no evidence of inguinal lymphadenopathy Extremities: no edema, erythema, or tenderness Neurologic: Grossly intact Psychiatric: mood appropriate, affect full  Results for orders placed or performed in visit on 06/28/19 (from the past 24 hour(s))  POCT Urinalysis Dipstick     Status: Abnormal   Collection Time: 06/28/19  4:00 PM  Result Value Ref Range   Color, UA Light yellow    Clarity, UA cloudy    Glucose, UA Negative Negative   Bilirubin, UA Negative    Ketones, UA Negative    Spec Grav, UA 1.025 1.010 - 1.025   Blood, UA Large    pH, UA 6.0 5.0 - 8.0   Protein, UA Positive (A) Negative   Urobilinogen, UA 0.2 0.2 or 1.0 E.U./dL   Nitrite, UA Negative    Leukocytes, UA Small  (1+) (A) Negative   Appearance Light yellow    Odor Slight   POCT Wet Prep [facility] [place])     Status: Normal   Collection Time: 06/28/19 10:00 PM  Result Value Ref Range   Source Wet Prep POC vagina    WBC, Wet Prep HPF POC     Bacteria Wet Prep HPF POC     BACTERIA WET PREP MORPHOLOGY POC     Clue Cells Wet Prep HPF POC None None   Clue Cells Wet Prep Whiff POC     Yeast Wet Prep HPF POC None None   KOH Wet Prep POC     Trichomonas Wet Prep HPF POC Absent Absent     Assessment: 33 y.o. G4 P3013 for routine gyn exam Urinary frequency/urgency/ voiding small amounts/ back pain-R/O UTI, R/O kidney stones  Urine culture sent  Bactrim DS BID x 7days Pelvic pain/dyspareunia: pelvic ultrasound and follow up Vaginal discharge: no evidence of vaginitis Plan:    1) Breast cancer screening - recommend monthly self breast exam. start mammograms at age 70  2) STI screening was offered and declined.This was done recently at PCP  3) Cervical cancer screening - Pap was done.   4) Contraception - continue Junel 1/20 for now. Need to discuss changing method of birth control if continues to smoke  5) Routine healthcare maintenance including cholesterol and diabetes screening, screening for vitamin D3, and CBC (tired all the time) done today.  6) Tobacco cessation: Encouraged stopping smoking. Will give quitline # when she returns for follow up. Did discuss use of nicotine replacement, Wellbutrin and Chantix to help her quit.    Dalia Heading, CNM

## 2019-06-28 ENCOUNTER — Other Ambulatory Visit (HOSPITAL_COMMUNITY)
Admission: RE | Admit: 2019-06-28 | Discharge: 2019-06-28 | Disposition: A | Discharge status: Home / Self Care | Payer: BLUE CROSS/BLUE SHIELD | Source: Ambulatory Visit | Attending: Certified Nurse Midwife | Admitting: Certified Nurse Midwife

## 2019-06-28 ENCOUNTER — Ambulatory Visit (INDEPENDENT_AMBULATORY_CARE_PROVIDER_SITE_OTHER): Payer: BLUE CROSS/BLUE SHIELD | Admitting: Certified Nurse Midwife

## 2019-06-28 ENCOUNTER — Other Ambulatory Visit: Payer: Self-pay

## 2019-06-28 ENCOUNTER — Encounter: Payer: Self-pay | Admitting: Certified Nurse Midwife

## 2019-06-28 VITALS — BP 120/70 | HR 75 | Ht 64.0 in | Wt 232.0 lb

## 2019-06-28 DIAGNOSIS — R350 Frequency of micturition: Secondary | ICD-10-CM | POA: Diagnosis not present

## 2019-06-28 DIAGNOSIS — Z124 Encounter for screening for malignant neoplasm of cervix: Secondary | ICD-10-CM | POA: Insufficient documentation

## 2019-06-28 DIAGNOSIS — Z01419 Encounter for gynecological examination (general) (routine) without abnormal findings: Secondary | ICD-10-CM | POA: Insufficient documentation

## 2019-06-28 DIAGNOSIS — R5383 Other fatigue: Secondary | ICD-10-CM

## 2019-06-28 DIAGNOSIS — Z1321 Encounter for screening for nutritional disorder: Secondary | ICD-10-CM

## 2019-06-28 DIAGNOSIS — N898 Other specified noninflammatory disorders of vagina: Secondary | ICD-10-CM | POA: Diagnosis not present

## 2019-06-28 DIAGNOSIS — Z131 Encounter for screening for diabetes mellitus: Secondary | ICD-10-CM

## 2019-06-28 DIAGNOSIS — E669 Obesity, unspecified: Secondary | ICD-10-CM

## 2019-06-28 DIAGNOSIS — N941 Unspecified dyspareunia: Secondary | ICD-10-CM

## 2019-06-28 DIAGNOSIS — R102 Pelvic and perineal pain: Secondary | ICD-10-CM

## 2019-06-28 DIAGNOSIS — Z13220 Encounter for screening for lipoid disorders: Secondary | ICD-10-CM

## 2019-06-28 DIAGNOSIS — N760 Acute vaginitis: Secondary | ICD-10-CM

## 2019-06-28 DIAGNOSIS — Z130 Encounter for screening for diseases of the blood and blood-forming organs and certain disorders involving the immune mechanism: Secondary | ICD-10-CM

## 2019-06-28 LAB — POCT URINALYSIS DIPSTICK
Bilirubin, UA: NEGATIVE
Glucose, UA: NEGATIVE
Ketones, UA: NEGATIVE
Nitrite, UA: NEGATIVE
Protein, UA: POSITIVE — AB
Spec Grav, UA: 1.025 (ref 1.010–1.025)
Urobilinogen, UA: 0.2 E.U./dL
pH, UA: 6 (ref 5.0–8.0)

## 2019-06-28 LAB — POCT WET PREP (WET MOUNT): Trichomonas Wet Prep HPF POC: ABSENT

## 2019-06-28 MED ORDER — SULFAMETHOXAZOLE-TRIMETHOPRIM 800-160 MG PO TABS: 1.0000 | ORAL_TABLET | Freq: Two times a day (BID) | ORAL | 0 refills | Status: DC

## 2019-06-29 LAB — CBC WITH DIFFERENTIAL/PLATELET
Basophils Absolute: 0.1 10*3/uL (ref 0.0–0.2)
Basos: 1 %
EOS (ABSOLUTE): 0.1 10*3/uL (ref 0.0–0.4)
Eos: 2 %
Hematocrit: 37 % (ref 34.0–46.6)
Hemoglobin: 12.5 g/dL (ref 11.1–15.9)
Immature Grans (Abs): 0 10*3/uL (ref 0.0–0.1)
Immature Granulocytes: 0 %
Lymphocytes Absolute: 3.2 10*3/uL — ABNORMAL HIGH (ref 0.7–3.1)
Lymphs: 41 %
MCH: 29.6 pg (ref 26.6–33.0)
MCHC: 33.8 g/dL (ref 31.5–35.7)
MCV: 88 fL (ref 79–97)
Monocytes Absolute: 0.6 10*3/uL (ref 0.1–0.9)
Monocytes: 8 %
Neutrophils Absolute: 3.9 10*3/uL (ref 1.4–7.0)
Neutrophils: 48 %
Platelets: 322 10*3/uL (ref 150–450)
RBC: 4.22 x10E6/uL (ref 3.77–5.28)
RDW: 12.1 % (ref 11.7–15.4)
WBC: 7.9 10*3/uL (ref 3.4–10.8)

## 2019-06-29 LAB — LIPID PANEL
Chol/HDL Ratio: 3 ratio (ref 0.0–4.4)
Cholesterol, Total: 125 mg/dL (ref 100–199)
HDL: 42 mg/dL (ref >39)
LDL Calculated: 68 mg/dL (ref 0–99)
Triglycerides: 77 mg/dL (ref 0–149)
VLDL Cholesterol Cal: 15 mg/dL (ref 5–40)

## 2019-06-29 LAB — HEMOGLOBIN A1C
Est. average glucose Bld gHb Est-mCnc: 94 mg/dL
Hgb A1c MFr Bld: 4.9 % (ref 4.8–5.6)

## 2019-06-29 LAB — VITAMIN D 25 HYDROXY (VIT D DEFICIENCY, FRACTURES): Vit D, 25-Hydroxy: 19.3 ng/mL — ABNORMAL LOW (ref 30.0–100.0)

## 2019-06-30 ENCOUNTER — Telehealth: Payer: Self-pay

## 2019-06-30 LAB — URINE CULTURE

## 2019-06-30 NOTE — Telephone Encounter (Signed)
Per Epic pharmacy rcvd at 4:15pm 06/28/2019. Pharmacy verified Bactrim DS & Birth control ready for p/u.

## 2019-06-30 NOTE — Telephone Encounter (Signed)
LMVM to notify pt. 

## 2019-06-30 NOTE — Telephone Encounter (Signed)
Pt saw CLG 06/28/2019 & was treated to be treated w/abx. [facility] has not received rx. [phone] ok to leave vm

## 2019-07-03 ENCOUNTER — Encounter: Payer: Self-pay | Admitting: Certified Nurse Midwife

## 2019-07-03 ENCOUNTER — Telehealth: Payer: Self-pay

## 2019-07-03 NOTE — Telephone Encounter (Signed)
Pt reports she understand Vit D lab result but didn't really understand the rest of her results. Would like to discuss other results. She is also requesting a rx for Vit D to take once weekly as she doesn't do well with rememberin daily meds. [phone]

## 2019-07-05 LAB — CYTOLOGY - PAP
Diagnosis: NEGATIVE
HPV: NOT DETECTED

## 2019-07-07 ENCOUNTER — Other Ambulatory Visit: Payer: Self-pay | Admitting: Certified Nurse Midwife

## 2019-07-07 ENCOUNTER — Telehealth: Payer: Self-pay

## 2019-07-07 DIAGNOSIS — R7989 Other specified abnormal findings of blood chemistry: Secondary | ICD-10-CM

## 2019-07-07 DIAGNOSIS — E559 Vitamin D deficiency, unspecified: Secondary | ICD-10-CM | POA: Insufficient documentation

## 2019-07-07 MED ORDER — ERGOCALCIFEROL 1.25 MG (50000 UT) PO CAPS: 50000.0000 [IU] | ORAL_CAPSULE | ORAL | 0 refills | Status: AC

## 2019-07-07 NOTE — Telephone Encounter (Signed)
Pt returning CG call. [phone]

## 2019-07-07 NOTE — Telephone Encounter (Signed)
Called patient with results 07/07/2019

## 2019-07-18 ENCOUNTER — Other Ambulatory Visit: Payer: BLUE CROSS/BLUE SHIELD

## 2019-07-18 ENCOUNTER — Ambulatory Visit: Payer: BLUE CROSS/BLUE SHIELD | Admitting: Certified Nurse Midwife

## 2019-08-04 ENCOUNTER — Other Ambulatory Visit: Payer: Self-pay

## 2019-08-04 ENCOUNTER — Encounter: Payer: Self-pay | Admitting: Certified Nurse Midwife

## 2019-08-04 ENCOUNTER — Ambulatory Visit (INDEPENDENT_AMBULATORY_CARE_PROVIDER_SITE_OTHER): Payer: BLUE CROSS/BLUE SHIELD

## 2019-08-04 ENCOUNTER — Ambulatory Visit (INDEPENDENT_AMBULATORY_CARE_PROVIDER_SITE_OTHER): Payer: BLUE CROSS/BLUE SHIELD | Admitting: Certified Nurse Midwife

## 2019-08-04 VITALS — BP 120/80 | Ht 64.0 in | Wt 232.0 lb

## 2019-08-04 DIAGNOSIS — F17200 Nicotine dependence, unspecified, uncomplicated: Secondary | ICD-10-CM | POA: Diagnosis not present

## 2019-08-04 DIAGNOSIS — N941 Unspecified dyspareunia: Secondary | ICD-10-CM | POA: Diagnosis not present

## 2019-08-04 DIAGNOSIS — R102 Pelvic and perineal pain: Secondary | ICD-10-CM

## 2019-08-04 NOTE — Progress Notes (Signed)
  HPI: :Marilyn Li is a 33 year old  female, G4 P3013, whose LMP was 07/28/2019. She had an ultrasound today due to her hx of chronic pelvic pain and dyspareunia.   She has seen Dr. Cristino Martes in the past who recommended a dx lap, but patient  declined the surgery at that time. She is currently on OCPs which has reduced the menstrual flow and lessened her dysmenorrhea.    Ultrasound demonstrates a generous size uterus measuring 9.3 x 6 x 5.4 cm. Endometrial thickness is 4.5mm. Ovaries appear normal.  No masses were seen.  PMHx: She  has a past medical history of Anxiety and depression, Ectopic pregnancy, Obesity, and Recurrent vaginitis. Also,  has a past surgical history that includes Tonsillectomy (2007); Adenoidectomy (1986); Ectopic pregnancy surgery (2006); and Dilation and curettage of uterus (2010)., family history includes Arthritis in her mother; Brain cancer in her father; COPD in her mother; Diabetes in her father; Diabetes Mellitus II in her mother; Hypertension in her father; Stroke in her father.,  reports that she has been smoking cigarettes. She has never used smokeless tobacco. She reports current alcohol use. She reports that she does not use drugs.  She has a current medication list which includes the following prescription(s): buspirone, ergocalciferol, junel fe 1/20, and sulfamethoxazole-trimethoprim. Also, has No Known Allergies.  ROS-see HPI  Objective: BP 120/80   Ht 5\' 4"  (1.626 m)   Wt 232 lb (105.2 kg)   LMP 07/28/2019   BMI 39.82 kg/m   Physical examination Constitutional NAD, Conversant  Skin No rashes, lesions or ulceration.   Extremities: Moves all appropriately.  Normal ROM for age. No lymphadenopathy.  Neuro: Grossly intact  Psych: Oriented to PPT.  Normal mood. Normal affect.   Assessment:  Chronic pelvic pain and dyspareunia Normal pelvic ultrasound   Plan: DIscussed the results of the pelvic ultrasound and the limitations of ultrasound in  diagnosing some etiologies of pelvic pain. She is interested in for evaluation of her pain with a diagnostic laparoscopy. Will refer to Dr Gilman Schmidt per patient request. Patient is 39 and smokes-have discussed the risks of smoking and taking OCPs and encouraged her to stop smoking  Dalia Heading, CNM

## 2019-08-07 DIAGNOSIS — F17200 Nicotine dependence, unspecified, uncomplicated: Secondary | ICD-10-CM | POA: Insufficient documentation

## 2019-08-09 ENCOUNTER — Other Ambulatory Visit: Payer: Self-pay | Admitting: Certified Nurse Midwife

## 2019-08-17 ENCOUNTER — Ambulatory Visit: Payer: BLUE CROSS/BLUE SHIELD | Admitting: Obstetrics and Gynecology

## 2019-09-29 ENCOUNTER — Encounter: Payer: Self-pay | Admitting: Nurse Practitioner

## 2019-09-29 ENCOUNTER — Other Ambulatory Visit: Payer: Self-pay

## 2019-09-29 ENCOUNTER — Ambulatory Visit: Payer: BLUE CROSS/BLUE SHIELD | Admitting: Nurse Practitioner

## 2019-09-29 VITALS — BP 121/73 | HR 80 | Temp 97.8°F | Resp 16 | Ht 64.0 in | Wt 233.6 lb

## 2019-09-29 DIAGNOSIS — N390 Urinary tract infection, site not specified: Secondary | ICD-10-CM | POA: Diagnosis not present

## 2019-09-29 DIAGNOSIS — R300 Dysuria: Secondary | ICD-10-CM | POA: Diagnosis not present

## 2019-09-29 DIAGNOSIS — N760 Acute vaginitis: Secondary | ICD-10-CM | POA: Diagnosis not present

## 2019-09-29 DIAGNOSIS — B9689 Other specified bacterial agents as the cause of diseases classified elsewhere: Secondary | ICD-10-CM

## 2019-09-29 DIAGNOSIS — F411 Generalized anxiety disorder: Secondary | ICD-10-CM

## 2019-09-29 DIAGNOSIS — R319 Hematuria, unspecified: Secondary | ICD-10-CM

## 2019-09-29 LAB — POCT URINALYSIS DIPSTICK
Bilirubin, UA: NEGATIVE
Glucose, UA: NEGATIVE
Ketones, UA: NEGATIVE
Nitrite, UA: NEGATIVE
Protein, UA: POSITIVE — AB
Spec Grav, UA: 1.01 (ref 1.010–1.025)
Urobilinogen, UA: 0.2 E.U./dL
pH, UA: 6 (ref 5.0–8.0)

## 2019-09-29 MED ORDER — SULFAMETHOXAZOLE-TRIMETHOPRIM 800-160 MG PO TABS: 1.0000 | ORAL_TABLET | Freq: Two times a day (BID) | ORAL | 0 refills | Status: DC

## 2019-09-29 MED ORDER — BUSPIRONE HCL 10 MG PO TABS: ORAL_TABLET | ORAL | 2 refills | Status: DC

## 2019-09-29 MED ORDER — METRONIDAZOLE 0.75 % VA GEL: 1.0000 | Freq: Two times a day (BID) | VAGINAL | 1 refills | Status: DC

## 2019-09-29 NOTE — Progress Notes (Signed)
[facility] [address] [place], [place] [address]  Internal MEDICINE  Office Visit Note  Patient Name: Marilyn GrahamLatasha M Li  19147810/23/1987  [id number]  Date of Service: 09/29/2019    Pt is here for a sick visit.  Chief Complaint  Patient presents with  . Urinary Tract Infection    lower back pain, feels like its burning, vaginal odor and discharge      The patient is here for acute visit. She is complaining of vaginal discharge with odor. Has been going on for a couple weeks. Has vaginal itching with irritation. Nothing she thinks of makes this worse or better. She also has bilateral flank pain. She states this has been going on for about a week longer than the vaginal discharge. Nothing she can think of makes the pain better or worse. Just nagging, irritating pain all the time. Taking ibuprofen or tylenol has not helped the pain. Massage and heat have not worked either. Her urine sample is positive for large blood, small WBC and positive for protein.        Current Medication:  Outpatient Encounter Medications as of 09/29/2019  Medication Sig  . busPIRone (BUSPAR) 10 MG tablet Take 1/2 to 1 tablet po BID prn anxiety  . JUNEL FE 1/20 1-20 MG-MCG tablet Take 1 tab po as directed  . [DISCONTINUED] busPIRone (BUSPAR) 10 MG tablet Take 1/2 to 1 tablet po BID prn anxiety  . metroNIDAZOLE (METROGEL) 0.75 % vaginal gel Place 1 Applicatorful vaginally 2 (two) times daily.  Marland Kitchen. sulfamethoxazole-trimethoprim (BACTRIM DS) 800-160 MG tablet Take 1 tablet by mouth 2 (two) times daily.  . [DISCONTINUED] sulfamethoxazole-trimethoprim (BACTRIM DS) 800-160 MG tablet Take 1 tablet by mouth 2 (two) times daily. (Patient not taking: Reported on 09/29/2019)   No facility-administered encounter medications on file as of 09/29/2019.       Medical History: Past Medical History:  Diagnosis Date  . Anxiety and depression   . Ectopic pregnancy   . Obesity   . Recurrent vaginitis       Today's Vitals   09/29/19 0830  BP: 121/73  Pulse: 80  Resp: 16  Temp: 97.8 F (36.6 C)  SpO2: 100%  Weight: 233 lb 9.6 oz (106 kg)  Height: 5\' 4"  (1.626 m)   Body mass index is 40.1 kg/m.  Review of Systems  Constitutional: Negative for chills, fatigue and unexpected weight change.  HENT: Negative for congestion, postnasal drip, rhinorrhea, sneezing and sore throat.   Respiratory: Negative for cough, chest tightness and shortness of breath.   Cardiovascular: Negative for chest pain and palpitations.  Gastrointestinal: Negative for abdominal pain, constipation, diarrhea, nausea and vomiting.  Endocrine: Negative.   Genitourinary: Positive for dysuria, flank pain and vaginal discharge. Negative for frequency.  Musculoskeletal: Positive for back pain. Negative for arthralgias, joint swelling and neck pain.  Skin: Negative for rash.  Neurological: Negative for dizziness, tremors, numbness and headaches.  Hematological: Negative for adenopathy. Does not bruise/bleed easily.  Psychiatric/Behavioral: Negative for behavioral problems (Depression), sleep disturbance and suicidal ideas. The patient is nervous/anxious.     Physical Exam Vitals signs and nursing note reviewed.  Constitutional:      General: She is not in acute distress.    Appearance: Normal appearance. She is well-developed. She is obese. She is not diaphoretic.  HENT:     Head: Normocephalic and atraumatic.     Mouth/Throat:     Pharynx: No oropharyngeal exudate.  Eyes:     Pupils: Pupils  are equal, round, and reactive to light.  Neck:     Musculoskeletal: Normal range of motion and neck supple.     Thyroid: No thyromegaly.     Vascular: No JVD.     Trachea: No tracheal deviation.  Cardiovascular:     Rate and Rhythm: Normal rate and regular rhythm.     Heart sounds: Normal heart sounds. No murmur. No friction rub. No gallop.   Pulmonary:     Effort: Pulmonary effort is normal. No respiratory distress.      Breath sounds: Normal breath sounds. No wheezing or rales.  Chest:     Chest wall: No tenderness.  Abdominal:     General: Bowel sounds are normal.     Palpations: Abdomen is soft.     Tenderness: There is abdominal tenderness.     Comments: Mild suprapubic tenderness present.   Genitourinary:    Comments: Urine sample is positive for protein, small WBC, and large blood. Bilateral flank pain present.  Musculoskeletal: Normal range of motion.  Lymphadenopathy:     Cervical: No cervical adenopathy.  Skin:    General: Skin is warm and dry.  Neurological:     Mental Status: She is alert and oriented to person, place, and time.     Cranial Nerves: No cranial nerve deficit.  Psychiatric:        Behavior: Behavior normal.        Thought Content: Thought content normal.        Judgment: Judgment normal.   Assessment/Plan: 1. Urinary tract infection with hematuria, site unspecified Start bactrim DS bid for 10 days. Send urine for culture and sensitivity and adjust antibiotics as indicated.  - CULTURE, URINE COMPREHENSIVE - sulfamethoxazole-trimethoprim (BACTRIM DS) 800-160 MG tablet; Take 1 tablet by mouth 2 (two) times daily.  Dispense: 20 tablet; Refill: 0  2. Bacterial vaginitis Metronidazole vaginal gel may be used twice daiy for next 7 days.  - metroNIDAZOLE (METROGEL) 0.75 % vaginal gel; Place 1 Applicatorful vaginally 2 (two) times daily.  Dispense: 140 g; Refill: 1  3. Generalized anxiety disorder Well managed. Ok to continue buspirone 10mg  twice daily as needed for acute anxiety. Refill provided today.  - busPIRone (BUSPAR) 10 MG tablet; Take 1/2 to 1 tablet po BID prn anxiety  Dispense: 60 tablet; Refill: 2  4. Dysuria - POCT Urinalysis Dipstick  General Counseling: Marilyn Li verbalizes understanding of the findings of todays visit and agrees with plan of treatment. I have discussed any further diagnostic evaluation that may be needed or ordered today. We also reviewed her  medications today. she has been encouraged to call the office with any questions or concerns that should arise related to todays visit.    Counseling:  This patient was seen by FNP Collaboration with Dr Vincent Gros as a part of collaborative care agreement  Orders Placed This Encounter  Procedures  . CULTURE, URINE COMPREHENSIVE  . POCT Urinalysis Dipstick    Meds ordered this encounter  Medications  . sulfamethoxazole-trimethoprim (BACTRIM DS) 800-160 MG tablet    Sig: Take 1 tablet by mouth 2 (two) times daily.    Dispense:  20 tablet    Refill:  0    Order Specific Question:   Supervising Provider    Answer:   [name] [[id number]]  . metroNIDAZOLE (METROGEL) 0.75 % vaginal gel    Sig: Place 1 Applicatorful vaginally 2 (two) times daily.    Dispense:  140 g  Refill:  1    Order Specific Question:   Supervising Provider    Answer:   Lavera Guise [[id number]]  . busPIRone (BUSPAR) 10 MG tablet    Sig: Take 1/2 to 1 tablet po BID prn anxiety    Dispense:  60 tablet    Refill:  2    Order Specific Question:   Supervising Provider    Answer:   Lavera Guise [[id number]]    Time spent: 25 Minutes

## 2019-10-03 LAB — CULTURE, URINE COMPREHENSIVE

## 2019-10-04 NOTE — Progress Notes (Signed)
Patient started on bactrim during visit.

## 2019-10-23 ENCOUNTER — Other Ambulatory Visit: Payer: Self-pay | Admitting: Nurse Practitioner

## 2019-10-23 DIAGNOSIS — F411 Generalized anxiety disorder: Secondary | ICD-10-CM

## 2019-12-08 ENCOUNTER — Encounter: Payer: Self-pay | Admitting: Obstetrics and Gynecology

## 2019-12-08 ENCOUNTER — Ambulatory Visit: Payer: BLUE CROSS/BLUE SHIELD | Admitting: Obstetrics and Gynecology

## 2019-12-08 ENCOUNTER — Other Ambulatory Visit: Payer: Self-pay

## 2019-12-08 ENCOUNTER — Ambulatory Visit (INDEPENDENT_AMBULATORY_CARE_PROVIDER_SITE_OTHER): Payer: BLUE CROSS/BLUE SHIELD | Admitting: Obstetrics and Gynecology

## 2019-12-08 VITALS — BP 128/82 | HR 91 | Ht 64.0 in | Wt 243.0 lb

## 2019-12-08 DIAGNOSIS — N941 Unspecified dyspareunia: Secondary | ICD-10-CM

## 2019-12-08 DIAGNOSIS — R102 Pelvic and perineal pain unspecified side: Secondary | ICD-10-CM

## 2019-12-08 LAB — POCT URINALYSIS DIPSTICK
Bilirubin, UA: NEGATIVE
Glucose, UA: NEGATIVE
Ketones, UA: NEGATIVE
Leukocytes, UA: NEGATIVE
Nitrite, UA: NEGATIVE
Protein, UA: NEGATIVE
Spec Grav, UA: 1.01 (ref 1.010–1.025)
pH, UA: 7 (ref 5.0–8.0)

## 2019-12-08 NOTE — Patient Instructions (Signed)
I value your feedback and entrusting us with your care. If you get a [facility] patient survey, I would appreciate you taking the time to let us know about your experience today. Thank you!  As of November 09, 2019, your lab results will be released to your MyChart immediately, before I even have a chance to see them. Please give me time to review them and contact you if there are any abnormalities. Thank you for your patience.  

## 2019-12-08 NOTE — Progress Notes (Signed)
Marilyn Code, MD   Chief Complaint  Marilyn Li presents with  . Gynecologic Exam    pelvic pain x 1 wk denies UTI S&S    HPI:      Marilyn Li is a 34 y.o. [id number] who LMP was Marilyn Li's last menstrual period was 12/06/2019., presents today for pelvic pain/pressure/ache for the past wk. Sx were unbareable with sex, and then lasted a few days. Tried Tylenol with a little improvement. Also notes pain with walking. Started bleeding 3rd wk of pills this cycle, and now pain is a little better. No late/missed pills. Hx of CPP and dysparuenia, neg GYN u/s 9/20. Has appt with Dr. Jerene Pitch 1/21. Pt came in because pain was worse compared to her usual CPP and dyspareunia.  Denies urin, vag, GI sx. No fevers, LBP.  She is sex active, on OCPs, No new partners. No bleeding with sex.  Marilyn Li Active Problem List   Diagnosis Date Noted  . Pelvic pain 12/08/2019  . Dyspareunia in female 12/08/2019  . Tobacco use disorder 08/07/2019  . Low vitamin D level 07/07/2019  . Other fatigue 07/27/2018  . Current moderate episode of major depressive disorder without prior episode (HCC) 07/27/2018  . Generalized anxiety disorder 07/27/2018  . Bacterial vaginitis 05/27/2018  . Candidiasis 05/27/2018  . Urinary tract infection with hematuria 05/27/2018  . Dysuria 05/27/2018  . Encounter for well adult exam with abnormal findings 11/16/2017    Past Surgical History:  Procedure Laterality Date  . ADENOIDECTOMY  1986  . DILATION AND CURETTAGE OF UTERUS  Mar 26, 2009  . ECTOPIC PREGNANCY SURGERY  Mar 26, 2005   Dr Toya Smothers  . TONSILLECTOMY  2006/03/26    Family History  Problem Relation Age of Onset  . Diabetes Mellitus II Mother   . COPD Mother   . Arthritis Mother   . Stroke Father   . Hypertension Father   . Brain cancer Father        meningioma-died 2013-03-26 . Diabetes Father     Social History   Socioeconomic History  . Marital status: Single    Spouse name: Not on file  . Number of  children: Not on file  . Years of education: Not on file  . Highest education level: Not on file  Occupational History  . Not on file  Tobacco Use  . Smoking status: Current Every Day Smoker    Types: Cigarettes  . Smokeless tobacco: Never Used  Substance and Sexual Activity  . Alcohol use: Yes    Comment: occasionally  . Drug use: No  . Sexual activity: Yes    Birth control/protection: Pill  Other Topics Concern  . Not on file  Social History Narrative  . Not on file   Social Determinants of Health   Financial Resource Strain:   . Difficulty of Paying Living Expenses: Not on file  Food Insecurity:   . Worried About Programme researcher, broadcasting/film/video in the Last Year: Not on file  . Ran Out of Food in the Last Year: Not on file  Transportation Needs:   . Lack of Transportation (Medical): Not on file  . Lack of Transportation (Non-Medical): Not on file  Physical Activity:   . Days of Exercise per Week: Not on file  . Minutes of Exercise per Session: Not on file  Stress:   . Feeling of Stress : Not on file  Social Connections:   . Frequency of Communication with Friends and Family: Not on file  .  Frequency of Social Gatherings with Friends and Family: Not on file  . Attends Religious Services: Not on file  . Active Member of Clubs or Organizations: Not on file  . Attends Banker Meetings: Not on file  . Marital Status: Not on file  Intimate Partner Violence:   . Fear of Current or Ex-Partner: Not on file  . Emotionally Abused: Not on file  . Physically Abused: Not on file  . Sexually Abused: Not on file    Outpatient Medications Prior to Visit  Medication Sig Dispense Refill  . busPIRone (BUSPAR) 10 MG tablet TAKE 1/2 TO 1 TABLET BY MOUTH TWICE DAILY AS NEEDED FOR ANXIETY 180 tablet 1  . JUNEL FE 1/20 1-20 MG-MCG tablet Take 1 tab po as directed 1 Package 11  . metroNIDAZOLE (METROGEL) 0.75 % vaginal gel Place 1 Applicatorful vaginally 2 (two) times daily. (Marilyn Li  not taking: Reported on 12/08/2019) 140 g 1  . sulfamethoxazole-trimethoprim (BACTRIM DS) 800-160 MG tablet Take 1 tablet by mouth 2 (two) times daily. (Marilyn Li not taking: Reported on 12/08/2019) 20 tablet 0   No facility-administered medications prior to visit.      ROS:  Review of Systems  Constitutional: Negative for fever.  Gastrointestinal: Negative for blood in stool, constipation, diarrhea, nausea and vomiting.  Genitourinary: Positive for dyspareunia and pelvic pain. Negative for dysuria, flank pain, frequency, hematuria, urgency, vaginal bleeding, vaginal discharge and vaginal pain.  Musculoskeletal: Negative for back pain.  Skin: Negative for rash.  BREAST: No symptoms   OBJECTIVE:   Vitals:  BP 128/82 (BP Location: Left Arm, Marilyn Li Position: Sitting, Cuff Size: Large)   Pulse 91   Ht 5\' 4"  (1.626 m)   Wt 243 lb (110.2 kg)   LMP 12/06/2019   BMI 41.71 kg/m   Physical Exam Vitals reviewed.  Constitutional:      Appearance: She is well-developed.  Pulmonary:     Effort: Pulmonary effort is normal.  Genitourinary:    General: Normal vulva.     Pubic Area: No rash.      Labia:        Right: No rash, tenderness or lesion.        Left: No rash, tenderness or lesion.      Vagina: Bleeding present. No vaginal discharge, erythema or tenderness.     Cervix: Normal.     Uterus: Normal. Tender. Not enlarged.      Adnexa: Right adnexa normal and left adnexa normal.       Right: No mass or tenderness.         Left: No mass or tenderness.    Musculoskeletal:        General: Normal range of motion.     Cervical back: Normal range of motion.  Skin:    General: Skin is warm and dry.  Neurological:     General: No focal deficit present.     Mental Status: She is alert and oriented to person, place, and time.  Psychiatric:        Mood and Affect: Mood normal.        Behavior: Behavior normal.        Thought Content: Thought content normal.        Judgment: Judgment  normal.     Results: Results for orders placed or performed in visit on 12/08/19 (from the past 24 hour(s))  POCT Urinalysis Dipstick     Status: Normal   Collection Time: 12/08/19  3:31 PM  Result  Value Ref Range   Color, UA yellow    Clarity, UA lear    Glucose, UA Negative Negative   Bilirubin, UA neg    Ketones, UA neg    Spec Grav, UA 1.010 1.010 - 1.025   Blood, UA small    pH, UA 7.0 5.0 - 8.0   Protein, UA Negative Negative   Urobilinogen, UA     Nitrite, UA neg    Leukocytes, UA Negative Negative   Appearance     Odor      Assessment/Plan: Pelvic pain - Plan: POCT Urinalysis Dipstick; Neg UA, sx improved slightly once bleeding started. Mildly tender on exam. Offered u/s today vs watch and wait since sx improving. Pt decided to see what sx do. F/u Monday if sx persist/worsen for GYN u/s.   Dyspareunia in female--worse than usual, question related to early bleeding. F/u prn. Has appt with Dr. Gilman Schmidt 1/21.  BTB with OCPs--no late/missed pills. See if sx resolve next pill pack.     Return if symptoms worsen or fail to improve.  Carolee Channell B. Recardo Linn, PA-C 12/08/2019 3:33 PM

## 2019-12-13 ENCOUNTER — Ambulatory Visit: Payer: BLUE CROSS/BLUE SHIELD | Attending: Internal Medicine

## 2019-12-13 DIAGNOSIS — Z20822 Contact with and (suspected) exposure to covid-19: Secondary | ICD-10-CM

## 2019-12-14 LAB — NOVEL CORONAVIRUS, NAA: SARS-CoV-2, NAA: NOT DETECTED

## 2019-12-21 ENCOUNTER — Ambulatory Visit: Payer: BLUE CROSS/BLUE SHIELD | Admitting: Obstetrics and Gynecology

## 2020-01-12 ENCOUNTER — Other Ambulatory Visit: Payer: Self-pay

## 2020-01-12 ENCOUNTER — Ambulatory Visit (INDEPENDENT_AMBULATORY_CARE_PROVIDER_SITE_OTHER): Payer: BLUE CROSS/BLUE SHIELD | Admitting: Obstetrics and Gynecology

## 2020-01-12 ENCOUNTER — Encounter: Payer: Self-pay | Admitting: Obstetrics and Gynecology

## 2020-01-12 VITALS — BP 120/70 | Ht 64.0 in | Wt 238.0 lb

## 2020-01-12 DIAGNOSIS — N941 Unspecified dyspareunia: Secondary | ICD-10-CM

## 2020-01-12 DIAGNOSIS — N898 Other specified noninflammatory disorders of vagina: Secondary | ICD-10-CM

## 2020-01-12 DIAGNOSIS — R102 Pelvic and perineal pain: Secondary | ICD-10-CM

## 2020-01-12 NOTE — Progress Notes (Signed)
Patient ID: Marilyn Li, female   DOB: 1986-11-06, 34 y.o.   MRN: [id number]  Reason for Consult: Consult (Pelvic pain, possible surgery )   Referred by [name], MD  Subjective:     HPI:  Marilyn Li is a 34 y.o. female  She is here today for a discussion of her pelvic pain.  She has been having pain for several years, at least since Apr 06, 2016 mainly after intercourse. Pain is on the right. Somewhat sharp. "Feels like it's pushing against something." Sometimes pain is improved with NSAIDs, generally lingers for a few days after intercourse.   No change in discharge. No vaginal odor, irritation or itching.   Has a history of a ruptured ectopic.   Gynecological History Describes periods as  Every 28 day with OCP. Improvement in dysmenorrhea and menorrhagia with the pill.  Last pap smear: 04-07-19- NIL History of STDs: yes, hx of chlamydia in Apr 06, 2004, gonorrhea in 04-07-07. May of had delayed diagnosis of gonorrhea.  Sexually Active: yes, female partner. Pain with intercourse. Feels a sorness in uterus which lingers for several days. Generally improved with motrin. Has noticed some decrease in lubrication. No diminished desire. Same sexual partner for several years.   Obstetrical History [id number] SVD in 2005, 2008 and 04-06-2009 Ruptured ectopic 2005-04-06   Past Medical History:  Diagnosis Date  . Anxiety and depression   . Ectopic pregnancy   . Obesity   . Recurrent vaginitis    Family History  Problem Relation Age of Onset  . Diabetes Mellitus II Mother   . COPD Mother   . Arthritis Mother   . Stroke Father   . Hypertension Father   . Brain cancer Father        meningioma-died 06-Apr-2013 . Diabetes Father    Past Surgical History:  Procedure Laterality Date  . ADENOIDECTOMY  1986  . DILATION AND CURETTAGE OF UTERUS  04-06-09  . ECTOPIC PREGNANCY SURGERY  Apr 06, 2005   Dr Toya Smothers  . TONSILLECTOMY  Apr 06, 2006    Short Social History:  Social History   Tobacco Use  . Smoking  status: Current Every Day Smoker    Types: Cigarettes  . Smokeless tobacco: Never Used  Substance Use Topics  . Alcohol use: Yes    Comment: occasionally    No Known Allergies  Current Outpatient Medications  Medication Sig Dispense Refill  . busPIRone (BUSPAR) 10 MG tablet TAKE 1/2 TO 1 TABLET BY MOUTH TWICE DAILY AS NEEDED FOR ANXIETY 180 tablet 1  . JUNEL FE 1/20 1-20 MG-MCG tablet Take 1 tab po as directed 1 Package 11   No current facility-administered medications for this visit.    Review of Systems  Constitutional: Negative for chills, fatigue, fever and unexpected weight change.  HENT: Negative for trouble swallowing.  Eyes: Negative for loss of vision.  Respiratory: Negative for cough, shortness of breath and wheezing.  Cardiovascular: Negative for chest pain, leg swelling, palpitations and syncope.  GI: Negative for abdominal pain, blood in stool, diarrhea, nausea and vomiting.  GU: Negative for difficulty urinating, dysuria, frequency and hematuria.  Musculoskeletal: Negative for back pain, leg pain and joint pain.  Skin: Negative for rash.  Neurological: Negative for dizziness, headaches, light-headedness, numbness and seizures.  Psychiatric: Negative for behavioral problem, confusion, depressed mood and sleep disturbance.        Objective:  Objective   Vitals:   01/12/20 0957  BP: 120/70  Weight: 238 lb (108 kg)  Height:  5\' 4"  (1.626 m)   Body mass index is 40.85 kg/m.  Physical Exam Vitals and nursing note reviewed.  Constitutional:      Appearance: She is well-developed.  HENT:     Head: Normocephalic and atraumatic.  Eyes:     Pupils: Pupils are equal, round, and reactive to light.  Cardiovascular:     Rate and Rhythm: Normal rate and regular rhythm.  Pulmonary:     Effort: Pulmonary effort is normal. No respiratory distress.  Genitourinary:    Comments: External: Vulva normal. No lesions noted.  Speculum examination:  Cervix patulent . No  blood in the vaginal vault. Yellow white clumpy discharge.  Bimanual examination: Uterus midline, mobile, tender to palpation, slight globular shape. + CMT. Adnexa normal, nontender. Pelvis not fixed. Skin:    General: Skin is warm and dry.  Neurological:     Mental Status: She is alert and oriented to person, place, and time.  Psychiatric:        Behavior: Behavior normal.        Thought Content: Thought content normal.        Judgment: Judgment normal.        Assessment/Plan:    34 yo with chronic pelvic pain and dyspareunia  Discussed option for pelvic pain evaluation of diagnostic laparoscopy vs further imaging. Given appearance of cervix and uterine tenderness initial imagine my be more advantageous. If this evaluation is negative will consider laparoscopy.   Amb referral to Vascular for pelvic congestion vs. Adenomyosis.  UPT negative today Wet mount normal- confirm with nuswab Plan follow up in 3 months.   More than 40 minutes were spent face to face with the patient in the room with more than 50% of the time spent providing counseling and discussing the plan of management. We discussed her history in detail as well as evaluation of pelvic pain and follow up plan.   Adrian Prows MD [facility], [facility] 01/12/2020 6:05 PM

## 2020-01-17 LAB — NUSWAB VAGINITIS PLUS (VG+)
Candida albicans, NAA: NEGATIVE
Candida glabrata, NAA: NEGATIVE
Chlamydia trachomatis, NAA: NEGATIVE
Megasphaera 1: HIGH Score — AB
Neisseria gonorrhoeae, NAA: NEGATIVE
Trich vag by NAA: NEGATIVE

## 2020-01-31 ENCOUNTER — Other Ambulatory Visit: Payer: Self-pay

## 2020-01-31 ENCOUNTER — Ambulatory Visit: Payer: BLUE CROSS/BLUE SHIELD | Admitting: Adult Health

## 2020-01-31 ENCOUNTER — Encounter: Payer: Self-pay | Admitting: Adult Health

## 2020-02-29 ENCOUNTER — Encounter: Payer: Self-pay | Admitting: Obstetrics and Gynecology

## 2020-02-29 ENCOUNTER — Ambulatory Visit (INDEPENDENT_AMBULATORY_CARE_PROVIDER_SITE_OTHER): Payer: BLUE CROSS/BLUE SHIELD | Admitting: Obstetrics and Gynecology

## 2020-02-29 ENCOUNTER — Other Ambulatory Visit: Payer: Self-pay

## 2020-02-29 VITALS — BP 122/74 | Ht 64.0 in | Wt 234.0 lb

## 2020-02-29 DIAGNOSIS — Z113 Encounter for screening for infections with a predominantly sexual mode of transmission: Secondary | ICD-10-CM | POA: Diagnosis not present

## 2020-02-29 DIAGNOSIS — N898 Other specified noninflammatory disorders of vagina: Secondary | ICD-10-CM

## 2020-02-29 NOTE — Progress Notes (Signed)
Patient ID: Marilyn Li, female   DOB: 1986-07-08, 34 y.o.   MRN: [id number]  Reason for Consult: Vaginitis (Vaginal itching/odor x 2 weeks, was a white clumby discharge now it is white and thin)   Referred by [name], MD  Subjective:     HPI:  Marilyn Li is a 34 y.o. female. She reports vaginal itching and odor for the last 2 weeks. She has been having a clumpy white discharge that has now become thin. She questions if her partner was cheating on her and she would like STD testing today.  She saw IR for a consultation and is waiting to schedule pelvic MRI through their office.   Past Medical History:  Diagnosis Date  . Anxiety and depression   . Ectopic pregnancy   . Obesity   . Recurrent vaginitis    Family History  Problem Relation Age of Onset  . Diabetes Mellitus II Mother   . COPD Mother   . Arthritis Mother   . Stroke Father   . Hypertension Father   . Brain cancer Father        meningioma-died 2013/04/08 . Diabetes Father    Past Surgical History:  Procedure Laterality Date  . ADENOIDECTOMY  1986  . DILATION AND CURETTAGE OF UTERUS  2009/04/08  . ECTOPIC PREGNANCY SURGERY  2005-04-08   Dr Toya Smothers  . TONSILLECTOMY  04-08-2006    Short Social History:  Social History   Tobacco Use  . Smoking status: Current Every Day Smoker    Types: Cigarettes  . Smokeless tobacco: Never Used  Substance Use Topics  . Alcohol use: Yes    Comment: occasionally    No Known Allergies  Current Outpatient Medications  Medication Sig Dispense Refill  . busPIRone (BUSPAR) 10 MG tablet TAKE 1/2 TO 1 TABLET BY MOUTH TWICE DAILY AS NEEDED FOR ANXIETY 180 tablet 1  . JUNEL FE 1/20 1-20 MG-MCG tablet Take 1 tab po as directed 1 Package 11  . metroNIDAZOLE (FLAGYL) 500 MG tablet Take 1 tablet (500 mg total) by mouth 2 (two) times daily for 7 days. 14 tablet 0  . Vitamin D, Ergocalciferol, (DRISDOL) 1.25 MG (50000 UNIT) CAPS capsule TAKE 1 CAPSULE (50,000 UNITS TOTAL) BY  MOUTH ONCE A WEEK FOR 8 DOSES. 4 capsule 1   No current facility-administered medications for this visit.    REVIEW OF SYSTEMS      Objective:  Objective   Vitals:   02/29/20 1054  BP: 122/74  Weight: 234 lb (106.1 kg)  Height: 5\' 4"  (1.626 m)   Body mass index is 40.17 kg/m.  Physical Exam Vitals and nursing note reviewed.  Constitutional:      Appearance: She is well-developed.  HENT:     Head: Normocephalic and atraumatic.  Eyes:     Pupils: Pupils are equal, round, and reactive to light.  Cardiovascular:     Rate and Rhythm: Normal rate and regular rhythm.  Pulmonary:     Effort: Pulmonary effort is normal. No respiratory distress.  Genitourinary:    Comments: External: Normal appearing vulva. No lesions noted.  Speculum examination: Normal appearing cervix. No blood in the vaginal vault. Scant white discharge.  Bimanual examination: Uterus midline, non-tender, normal in size, shape and contour.  No CMT. No adnexal masses. No adnexal tenderness. Pelvis not fixed.    Skin:    General: Skin is warm and dry.  Neurological:     Mental Status: She is alert  and oriented to person, place, and time.  Psychiatric:        Behavior: Behavior normal.        Thought Content: Thought content normal.        Judgment: Judgment normal.    Wet Prep: PH: normal Clue Cells: Negative Fungal elements: Negative Trichomonas: Negative     Assessment/Plan:    34 yo with complaints of vaginitis.  Wet mount normal. Will wait to treat based off of results. Swabs sent and labs collected.  More than 20 minutes were spent face to face with the patient in the room, reviewing the medical record, labs and images, and coordinating care for the patient. The plan of management was discussed in detail and counseling was provided.    Adrian Prows MD [facility], [facility] 03/11/2020 8:14 PM

## 2020-03-04 ENCOUNTER — Other Ambulatory Visit: Payer: Self-pay | Admitting: Certified Nurse Midwife

## 2020-03-04 LAB — NUSWAB VAGINITIS PLUS (VG+)
Atopobium vaginae: HIGH Score — AB
BVAB 2: HIGH Score — AB
Candida albicans, NAA: NEGATIVE
Candida glabrata, NAA: NEGATIVE
Chlamydia trachomatis, NAA: NEGATIVE
Neisseria gonorrhoeae, NAA: NEGATIVE
Trich vag by NAA: NEGATIVE

## 2020-03-05 ENCOUNTER — Other Ambulatory Visit: Payer: Self-pay | Admitting: Obstetrics and Gynecology

## 2020-03-05 DIAGNOSIS — B9689 Other specified bacterial agents as the cause of diseases classified elsewhere: Secondary | ICD-10-CM

## 2020-03-05 DIAGNOSIS — N760 Acute vaginitis: Secondary | ICD-10-CM

## 2020-03-05 DIAGNOSIS — N898 Other specified noninflammatory disorders of vagina: Secondary | ICD-10-CM

## 2020-03-05 LAB — HEPATITIS PANEL, ACUTE
Hep A IgM: NEGATIVE
Hep B C IgM: NEGATIVE
Hep C Virus Ab: <0.1 s/co ratio (ref 0.0–0.9)
Hepatitis B Surface Ag: NEGATIVE

## 2020-03-05 LAB — HSV(HERPES SMPLX)ABS-I+II(IGG+IGM)-BLD
HSV 1 Glycoprotein G Ab, IgG: 46.2 index — ABNORMAL HIGH (ref 0.00–0.90)
HSV 2 IgG, Type Spec: <0.91 index (ref 0.00–0.90)
HSVI/II Comb IgM: 1.14 Ratio — ABNORMAL HIGH (ref 0.00–0.90)

## 2020-03-05 LAB — RPR: RPR Ser Ql: NONREACTIVE

## 2020-03-05 LAB — HIV ANTIBODY (ROUTINE TESTING W REFLEX): HIV Screen 4th Generation wRfx: NONREACTIVE

## 2020-03-05 MED ORDER — METRONIDAZOLE 500 MG PO TABS: 500.0000 mg | ORAL_TABLET | Freq: Two times a day (BID) | ORAL | 0 refills | Status: AC

## 2020-03-05 NOTE — Telephone Encounter (Signed)
Called and left detailed voicemail for patient regarding the result.

## 2020-03-11 ENCOUNTER — Encounter: Payer: Self-pay | Admitting: Obstetrics and Gynecology

## 2020-03-20 ENCOUNTER — Other Ambulatory Visit: Payer: Self-pay | Admitting: Obstetrics and Gynecology

## 2020-03-20 DIAGNOSIS — B3731 Acute candidiasis of vulva and vagina: Secondary | ICD-10-CM

## 2020-03-20 DIAGNOSIS — B373 Candidiasis of vulva and vagina: Secondary | ICD-10-CM

## 2020-03-20 MED ORDER — FLUCONAZOLE 150 MG PO TABS: 150.0000 mg | ORAL_TABLET | Freq: Once | ORAL | 0 refills | Status: AC

## 2020-03-20 NOTE — Progress Notes (Signed)
Rx diflucan for yeast vag after abx °

## 2020-03-21 NOTE — Progress Notes (Signed)
Duplicate encounter

## 2020-03-27 ENCOUNTER — Telehealth: Payer: Self-pay

## 2020-03-27 NOTE — Telephone Encounter (Signed)
Lmom to confirm and screen for 03-29-20 ov. 

## 2020-03-29 ENCOUNTER — Ambulatory Visit: Payer: BLUE CROSS/BLUE SHIELD | Admitting: Nurse Practitioner

## 2020-03-29 ENCOUNTER — Other Ambulatory Visit: Payer: Self-pay | Admitting: Certified Nurse Midwife

## 2020-04-05 ENCOUNTER — Telehealth: Payer: Self-pay

## 2020-04-05 NOTE — Telephone Encounter (Signed)
BILLED MISSED APPOINTMENT FEE FOR 03/29/20.

## 2020-04-12 ENCOUNTER — Ambulatory Visit: Payer: BLUE CROSS/BLUE SHIELD | Admitting: Obstetrics and Gynecology

## 2020-04-19 ENCOUNTER — Ambulatory Visit: Payer: BLUE CROSS/BLUE SHIELD | Admitting: Obstetrics and Gynecology

## 2020-05-02 ENCOUNTER — Other Ambulatory Visit: Payer: Self-pay

## 2020-05-21 ENCOUNTER — Other Ambulatory Visit: Payer: Self-pay

## 2020-05-21 MED ORDER — JUNEL FE 1/20 1-20 MG-MCG PO TABS: ORAL_TABLET | ORAL | 1 refills | Status: DC

## 2020-07-16 ENCOUNTER — Other Ambulatory Visit: Payer: Self-pay

## 2020-07-16 MED ORDER — JUNEL FE 1/20 1-20 MG-MCG PO TABS: ORAL_TABLET | ORAL | 1 refills | Status: DC

## 2020-07-16 NOTE — Telephone Encounter (Signed)
Pt calling; has sched appt; needs bc refilled; [facility].  [phone]  Left msg refill eRx'd.

## 2020-08-13 ENCOUNTER — Ambulatory Visit: Payer: BLUE CROSS/BLUE SHIELD | Admitting: Obstetrics and Gynecology

## 2020-08-29 ENCOUNTER — Encounter: Payer: Self-pay | Admitting: Nurse Practitioner

## 2020-08-29 NOTE — Telephone Encounter (Signed)
Virtual visit around 9 with dfk

## 2020-09-01 ENCOUNTER — Encounter: Payer: Self-pay | Admitting: Internal Medicine

## 2020-09-02 NOTE — Telephone Encounter (Signed)
Please dfk

## 2020-09-03 ENCOUNTER — Ambulatory Visit: Payer: BLUE CROSS/BLUE SHIELD | Admitting: Hospice and Palliative Medicine

## 2020-09-03 ENCOUNTER — Encounter: Payer: Self-pay | Admitting: Internal Medicine

## 2020-09-03 DIAGNOSIS — U071 COVID-19: Secondary | ICD-10-CM

## 2020-09-03 DIAGNOSIS — R059 Cough, unspecified: Secondary | ICD-10-CM

## 2020-09-03 MED ORDER — AZITHROMYCIN 250 MG PO TABS: ORAL_TABLET | ORAL | 0 refills | Status: DC

## 2020-09-03 MED ORDER — PREDNISONE 10 MG PO TABS: ORAL_TABLET | ORAL | 0 refills | Status: DC

## 2020-09-03 NOTE — Progress Notes (Signed)
[facility] [address] [place], [place] [address]  Internal MEDICINE  Telephone Visit  Patient Name: Marilyn Li  [id number]  [id number]  Date of Service: 09/07/2020  I connected with the patient at 1228 by telephone and verified the patients identity using two identifiers.   I discussed the limitations, risks, security and privacy concerns of performing an evaluation and management service by telephone and the availability of in person appointments. I also discussed with the patient that there may be a patient responsible charge related to the service.  The patient expressed understanding and agrees to proceed.    Chief Complaint  Patient presents with  . Telephone Assessment    [phone]   . Telephone Screen  . Acute Visit  . Covid Positive  . Chest Pain    tightness in chest  . Cough    HPI Patient is being seen today for sick visit Tested postivie for COVID 08/27/2020 She is feeling some better but continues to have congestion and tightness in her chest She was febrile at time of initial diagnosis of COVID, has since been afebrile Also has a cough, says it is dry cough but feels the congestion in her chest but has been unable to cough anything up Continues to have fatigue Denies shortness of breath  Current Medication: Outpatient Encounter Medications as of 09/03/2020  Medication Sig  . busPIRone (BUSPAR) 10 MG tablet TAKE 1/2 TO 1 TABLET BY MOUTH TWICE DAILY AS NEEDED FOR ANXIETY  . JUNEL FE 1/20 1-20 MG-MCG tablet Take 1 tab po as directed  . Vitamin D, Ergocalciferol, (DRISDOL) 1.25 MG (50000 UNIT) CAPS capsule TAKE 1 CAPSULE (50,000 UNITS TOTAL) BY MOUTH ONCE A WEEK FOR 8 DOSES.  Marland Kitchen azithromycin (ZITHROMAX Z-PAK) 250 MG tablet Take two 250 mg tablets on day one followed by one 250 mg tablet each day for four days.  . predniSONE (DELTASONE) 10 MG tablet Take 1 tablet three times a day with a meal for three for three days, take 1 tablet by twice  daily with a meal for 3 days, take 1 tablet once daily with a meal for 3 days   No facility-administered encounter medications on file as of 09/03/2020.    Surgical History: Past Surgical History:  Procedure Laterality Date  . ADENOIDECTOMY  1986  . DILATION AND CURETTAGE OF UTERUS  2009-04-09  . ECTOPIC PREGNANCY SURGERY  04/09/2005   Dr Toya Smothers  . TONSILLECTOMY  2006-04-09    Medical History: Past Medical History:  Diagnosis Date  . Anxiety and depression   . Ectopic pregnancy   . Obesity   . Recurrent vaginitis     Family History: Family History  Problem Relation Age of Onset  . Diabetes Mellitus II Mother   . COPD Mother   . Arthritis Mother   . Stroke Father   . Hypertension Father   . Brain cancer Father        meningioma-died Apr 09, 2013 . Diabetes Father     Social History   Socioeconomic History  . Marital status: Single    Spouse name: Not on file  . Number of children: Not on file  . Years of education: Not on file  . Highest education level: Not on file  Occupational History  . Not on file  Tobacco Use  . Smoking status: Current Every Day Smoker    Types: Cigarettes  . Smokeless tobacco: Never Used  Vaping Use  . Vaping Use: Never used  Substance  and Sexual Activity  . Alcohol use: Yes    Comment: occasionally  . Drug use: No  . Sexual activity: Yes    Birth control/protection: Pill  Other Topics Concern  . Not on file  Social History Narrative  . Not on file   Social Determinants of Health   Financial Resource Strain:   . Difficulty of Paying Living Expenses: Not on file  Food Insecurity:   . Worried About Programme researcher, broadcasting/film/video in the Last Year: Not on file  . Ran Out of Food in the Last Year: Not on file  Transportation Needs:   . Lack of Transportation (Medical): Not on file  . Lack of Transportation (Non-Medical): Not on file  Physical Activity:   . Days of Exercise per Week: Not on file  . Minutes of Exercise per Session: Not on file   Stress:   . Feeling of Stress : Not on file  Social Connections:   . Frequency of Communication with Friends and Family: Not on file  . Frequency of Social Gatherings with Friends and Family: Not on file  . Attends Religious Services: Not on file  . Active Member of Clubs or Organizations: Not on file  . Attends Banker Meetings: Not on file  . Marital Status: Not on file  Intimate Partner Violence:   . Fear of Current or Ex-Partner: Not on file  . Emotionally Abused: Not on file  . Physically Abused: Not on file  . Sexually Abused: Not on file    Review of Systems  Constitutional: Positive for fatigue. Negative for chills and diaphoresis.  HENT: Positive for congestion. Negative for ear pain, postnasal drip, sinus pressure and sinus pain.   Eyes: Negative for photophobia, discharge, redness, itching and visual disturbance.  Respiratory: Positive for cough. Negative for shortness of breath and wheezing.   Cardiovascular: Negative for chest pain, palpitations and leg swelling.  Gastrointestinal: Negative for abdominal pain, constipation, diarrhea, nausea and vomiting.  Genitourinary: Negative for dysuria and flank pain.  Musculoskeletal: Negative for arthralgias, back pain, gait problem and neck pain.  Skin: Negative for color change.  Allergic/Immunologic: Negative for environmental allergies and food allergies.  Neurological: Negative for dizziness and headaches.  Hematological: Does not bruise/bleed easily.  Psychiatric/Behavioral: Negative for agitation, behavioral problems (depression) and hallucinations.    Vital Signs: Temp 98.2 F (36.8 C)   Resp 16   Ht 5\' 4"  (1.626 m)   Wt 240 lb (108.9 kg)   BMI 41.20 kg/m    Observation/Objective: No acute distress noted    Assessment/Plan: 1. COVID-19 virus infection Will treat with Zithromax and short course of prednisone Will obtain CXR Advised to continue to follow proper quarantine procedures and to  call office if symptoms worsen or do not resolve within 10 days - azithromycin (ZITHROMAX Z-PAK) 250 MG tablet; Take two 250 mg tablets on day one followed by one 250 mg tablet each day for four days.  Dispense: 6 each; Refill: 0 - predniSONE (DELTASONE) 10 MG tablet; Take 1 tablet three times a day with a meal for three for three days, take 1 tablet by twice daily with a meal for 3 days, take 1 tablet once daily with a meal for 3 days  Dispense: 18 tablet; Refill: 0 - DG Chest 2 View; Future  2. Cough Will review results of CXR Advised to take OTC regular Mucinex to help loosen congestion in chest to enable to be expelled - DG Chest 2 View; Future  General Counseling: Jamesha verbalizes understanding of the findings of today's phone visit and agrees with plan of treatment. I have discussed any further diagnostic evaluation that may be needed or ordered today. We also reviewed her medications today. she has been encouraged to call the office with any questions or concerns that should arise related to todays visit.    Orders Placed This Encounter  Procedures  . DG Chest 2 View    Meds ordered this encounter  Medications  . azithromycin (ZITHROMAX Z-PAK) 250 MG tablet    Sig: Take two 250 mg tablets on day one followed by one 250 mg tablet each day for four days.    Dispense:  6 each    Refill:  0  . predniSONE (DELTASONE) 10 MG tablet    Sig: Take 1 tablet three times a day with a meal for three for three days, take 1 tablet by twice daily with a meal for 3 days, take 1 tablet once daily with a meal for 3 days    Dispense:  18 tablet    Refill:  0     Time spent: 25 Minutes   Lubertha Basque. Samir Ishaq AGNP-C Internal medicine

## 2020-09-07 ENCOUNTER — Encounter: Payer: Self-pay | Admitting: Hospice and Palliative Medicine

## 2020-09-12 ENCOUNTER — Encounter: Payer: Self-pay | Admitting: Hospice and Palliative Medicine

## 2020-09-12 NOTE — Progress Notes (Signed)
PCP:  [name], MD   Chief Complaint  Patient presents with   Gynecologic Exam   Injections    flu shot     HPI:      [name] is a 34 y.o. [phone] whose LMP was Patient's last menstrual period was 08/27/2020 (exact date)., presents today for her annual examination.  Her menses are regular every 28-30 days, lasting 4-5 days.  Dysmenorrhea mild, occurring first 1-2 days of flow. She does not have intermenstrual bleeding.  Sex activity: single partner, contraception - OCP (estrogen/progesterone). Hx of CPP/dyspareunia for many yrs--sx only with sex and in 1 area mid abdominal area. Had had neg GYN u/s. Dx lap vs amb ref to vascular for pelvic congestion syndrome discussed at 2/21 appt with Dr. Jerene Pitch. Pt to f/u prn.   Last Pap: 06/28/19  Results were: no abnormalities /neg HPV DNA  Hx of BV, confirmed on nuswab.   There is no FH of breast cancer. There is no FH of ovarian cancer. The patient does do self-breast exams.  Tobacco use: smokes 1/2 ppd, quit in past; not fully ready to quit Alcohol use: none No drug use.  Exercise: moderately active  She does get adequate calcium and Vitamin D in her diet.    Past Medical History:  Diagnosis Date   Anxiety and depression    Ectopic pregnancy    Obesity    Recurrent vaginitis     Past Surgical History:  Procedure Laterality Date   ADENOIDECTOMY  73   DILATION AND CURETTAGE OF UTERUS  2010   ECTOPIC PREGNANCY SURGERY  04/01/2005   Dr Toya Smothers   TONSILLECTOMY  Apr 01, 2006    Family History  Problem Relation Age of Onset   Diabetes Mellitus II Mother    COPD Mother    Arthritis Mother    Stroke Father    Hypertension Father    Brain cancer Father        meningioma-died Apr 01, 2013  Diabetes Father     Social History   Socioeconomic History   Marital status: Single    Spouse name: Not on file   Number of children: Not on file   Years of education: Not on file   Highest education  level: Not on file  Occupational History   Not on file  Tobacco Use   Smoking status: Current Every Day Smoker    Types: Cigarettes   Smokeless tobacco: Never Used  Vaping Use   Vaping Use: Never used  Substance and Sexual Activity   Alcohol use: Yes    Comment: occasionally   Drug use: No   Sexual activity: Not Currently    Birth control/protection: Pill  Other Topics Concern   Not on file  Social History Narrative   Not on file   Social Determinants of Health   Financial Resource Strain:    Difficulty of Paying Living Expenses: Not on file  Food Insecurity:    Worried About Running Out of Food in the Last Year: Not on file   [employer] of Food in the Last Year: Not on file  Transportation Needs:    Lack of Transportation (Medical): Not on file   Lack of Transportation (Non-Medical): Not on file  Physical Activity:    Days of Exercise per Week: Not on file   Minutes of Exercise per Session: Not on file  Stress:    Feeling of Stress : Not on file  Social Connections:  Frequency of Communication with Friends and Family: Not on file   Frequency of Social Gatherings with Friends and Family: Not on file   Attends Religious Services: Not on file   Active Member of Clubs or Organizations: Not on file   Attends Banker Meetings: Not on file   Marital Status: Not on file  Intimate Partner Violence:    Fear of Current or Ex-Partner: Not on file   Emotionally Abused: Not on file   Physically Abused: Not on file   Sexually Abused: Not on file     Current Outpatient Medications:    busPIRone (BUSPAR) 10 MG tablet, TAKE 1/2 TO 1 TABLET BY MOUTH TWICE DAILY AS NEEDED FOR ANXIETY, Disp: 180 tablet, Rfl: 1   JUNEL FE 1/20 1-20 MG-MCG tablet, Take 1 tab po as directed, Disp: 28 tablet, Rfl: 1     ROS:  Review of Systems  Constitutional: Negative for fatigue, fever and unexpected weight change.  Respiratory: Negative for cough,  shortness of breath and wheezing.   Cardiovascular: Negative for chest pain, palpitations and leg swelling.  Gastrointestinal: Negative for blood in stool, constipation, diarrhea, nausea and vomiting.  Endocrine: Negative for cold intolerance, heat intolerance and polyuria.  Genitourinary: Positive for dyspareunia. Negative for dysuria, flank pain, frequency, genital sores, hematuria, menstrual problem, pelvic pain, urgency, vaginal bleeding, vaginal discharge and vaginal pain.  Musculoskeletal: Negative for back pain, joint swelling and myalgias.  Skin: Negative for rash.  Neurological: Negative for dizziness, syncope, light-headedness, numbness and headaches.  Hematological: Negative for adenopathy.  Psychiatric/Behavioral: Negative for agitation, confusion, sleep disturbance and suicidal ideas. The patient is not nervous/anxious.    BREAST: No symptoms   Objective: BP 116/72    Ht 5\' 4"  (1.626 m)    Wt 235 lb (106.6 kg)    LMP 08/27/2020 (Exact Date)    BMI 40.34 kg/m    Physical Exam Constitutional:      Appearance: She is well-developed.  Genitourinary:     Vulva, vagina, cervix, right adnexa and left adnexa normal.     No vulval lesion or tenderness noted.     No vaginal discharge, erythema or tenderness.     No cervical polyp.     Uterus is tender.     Uterus is not enlarged.     No right or left adnexal mass present.     Right adnexa not tender.     Left adnexa not tender.     Genitourinary Comments: TENDER TO PALPATE RT UMBILICAL AREA  Neck:     Thyroid: No thyromegaly.  Cardiovascular:     Rate and Rhythm: Normal rate and regular rhythm.     Heart sounds: Normal heart sounds. No murmur heard.   Pulmonary:     Effort: Pulmonary effort is normal.     Breath sounds: Normal breath sounds.  Chest:     Breasts:        Right: No mass, nipple discharge, skin change or tenderness.        Left: No mass, nipple discharge, skin change or tenderness.  Abdominal:      Palpations: Abdomen is soft.     Tenderness: There is no abdominal tenderness. There is no guarding.  Musculoskeletal:        General: Normal range of motion.     Cervical back: Normal range of motion.  Neurological:     General: No focal deficit present.     Mental Status: She is alert and oriented to  person, place, and time.     Cranial Nerves: No cranial nerve deficit.  Skin:    General: Skin is warm and dry.  Psychiatric:        Mood and Affect: Mood normal.        Behavior: Behavior normal.        Thought Content: Thought content normal.        Judgment: Judgment normal.  Vitals reviewed.     Assessment/Plan: Encounter for annual routine gynecological examination  Encounter for surveillance of contraceptive pills - Plan: JUNEL FE 1/20 1-20 MG-MCG tablet; OCP RF  Need for immunization against influenza - Plan: Flu Vaccine QUAD 36+ mos IM  Meds ordered this encounter  Medications   JUNEL FE 1/20 1-20 MG-MCG tablet    Sig: Take 1 tab po as directed    Dispense:  28 tablet    Refill:  1    Order Specific Question:   Supervising Provider    Answer:   Nadara Mustard [[id number]]             GYN counsel adequate intake of calcium and vitamin D, diet and exercise, tobacco cessation     F/U  Return in about 1 year (around 09/13/2021).  Merlyn Conley B. Paz Winsett, PA-C 09/13/2020 11:35 AM

## 2020-09-13 ENCOUNTER — Other Ambulatory Visit: Payer: Self-pay

## 2020-09-13 ENCOUNTER — Telehealth: Payer: Self-pay

## 2020-09-13 ENCOUNTER — Ambulatory Visit (INDEPENDENT_AMBULATORY_CARE_PROVIDER_SITE_OTHER): Payer: BLUE CROSS/BLUE SHIELD | Admitting: Obstetrics and Gynecology

## 2020-09-13 ENCOUNTER — Encounter: Payer: Self-pay | Admitting: Obstetrics and Gynecology

## 2020-09-13 VITALS — BP 116/72 | Ht 64.0 in | Wt 235.0 lb

## 2020-09-13 DIAGNOSIS — Z3041 Encounter for surveillance of contraceptive pills: Secondary | ICD-10-CM

## 2020-09-13 DIAGNOSIS — Z01419 Encounter for gynecological examination (general) (routine) without abnormal findings: Secondary | ICD-10-CM | POA: Diagnosis not present

## 2020-09-13 DIAGNOSIS — Z23 Encounter for immunization: Secondary | ICD-10-CM | POA: Diagnosis not present

## 2020-09-13 MED ORDER — JUNEL FE 1/20 1-20 MG-MCG PO TABS: ORAL_TABLET | ORAL | 1 refills | Status: DC

## 2020-09-13 NOTE — Patient Instructions (Signed)
I value your feedback and entrusting us with your care. If you get a [facility] patient survey, I would appreciate you taking the time to let us know about your experience today. Thank you!  As of November 09, 2019, your lab results will be released to your MyChart immediately, before I even have a chance to see them. Please give me time to review them and contact you if there are any abnormalities. Thank you for your patience.  

## 2020-09-13 NOTE — Telephone Encounter (Signed)
Electronic Rxs not going through. Called pharmacy and [facility] Rx given verbally. Per pharmacist, pt has/goes through [facility] for [facility] and has history of 3 month supply and was asked if it was ok with 0 RF's. I advised okay.

## 2020-09-16 ENCOUNTER — Ambulatory Visit
Admission: RE | Admit: 2020-09-16 | Discharge: 2020-09-16 | Disposition: A | Discharge status: Home / Self Care | Payer: BLUE CROSS/BLUE SHIELD | Source: Ambulatory Visit | Attending: Hospice and Palliative Medicine | Admitting: Hospice and Palliative Medicine

## 2020-09-16 ENCOUNTER — Ambulatory Visit
Admission: RE | Admit: 2020-09-16 | Discharge: 2020-09-16 | Disposition: A | Discharge status: Home / Self Care | Payer: BLUE CROSS/BLUE SHIELD | Attending: Hospice and Palliative Medicine | Admitting: Hospice and Palliative Medicine

## 2020-09-16 DIAGNOSIS — U071 COVID-19: Secondary | ICD-10-CM | POA: Diagnosis not present

## 2020-09-16 DIAGNOSIS — R059 Cough, unspecified: Secondary | ICD-10-CM | POA: Insufficient documentation

## 2020-09-16 NOTE — Telephone Encounter (Signed)
Last I knew, it was 90 days after positive covid to get vaccinated. Idk if that has changed.

## 2020-09-18 ENCOUNTER — Encounter: Payer: Self-pay | Admitting: Hospice and Palliative Medicine

## 2020-09-18 ENCOUNTER — Ambulatory Visit: Payer: BLUE CROSS/BLUE SHIELD | Admitting: Hospice and Palliative Medicine

## 2020-09-18 ENCOUNTER — Other Ambulatory Visit: Payer: Self-pay

## 2020-09-18 DIAGNOSIS — F17210 Nicotine dependence, cigarettes, uncomplicated: Secondary | ICD-10-CM

## 2020-09-18 DIAGNOSIS — Z23 Encounter for immunization: Secondary | ICD-10-CM

## 2020-09-18 DIAGNOSIS — F411 Generalized anxiety disorder: Secondary | ICD-10-CM

## 2020-09-18 DIAGNOSIS — U071 COVID-19: Secondary | ICD-10-CM | POA: Diagnosis not present

## 2020-09-18 DIAGNOSIS — Z0001 Encounter for general adult medical examination with abnormal findings: Secondary | ICD-10-CM

## 2020-09-18 NOTE — Progress Notes (Signed)
[facility] [address] [place], [place] [address]  Internal MEDICINE  Office Visit Note  Patient Name: Marilyn Li  [id number]  [id number]  Date of Service: 09/21/2020  Chief Complaint  Patient presents with  . Follow-up  . Depression  . Anxiety  . Quality Metric Gaps    covid  . controlled substance form    reviewed with PT    HPI Patient is here for routine follow-up  She has fully recovered from her recent COVID19 illness, no further symptoms She would like to discuss when it is appropriate for her to receive the COVID vaccines Reports that her anxiety and depression remains well controlled on buspar Has had annual exam with her GYN this year, but discussed with her needing to review annual blood work Continues to smoke about a half a pack a day, is still working on complete cessation  Current Medication: Outpatient Encounter Medications as of 09/18/2020  Medication Sig  . busPIRone (BUSPAR) 10 MG tablet TAKE 1/2 TO 1 TABLET BY MOUTH TWICE DAILY AS NEEDED FOR ANXIETY  . JUNEL FE 1/20 1-20 MG-MCG tablet Take 1 tab po as directed   No facility-administered encounter medications on file as of 09/18/2020.    Surgical History: Past Surgical History:  Procedure Laterality Date  . ADENOIDECTOMY  1986  . DILATION AND CURETTAGE OF UTERUS  03-18-2009  . ECTOPIC PREGNANCY SURGERY  18-Mar-2005   Dr Toya Smothers  . TONSILLECTOMY  03/18/2006    Medical History: Past Medical History:  Diagnosis Date  . Anxiety and depression   . Ectopic pregnancy   . Obesity   . Recurrent vaginitis     Family History: Family History  Problem Relation Age of Onset  . Diabetes Mellitus II Mother   . COPD Mother   . Arthritis Mother   . Stroke Father   . Hypertension Father   . Brain cancer Father        meningioma-died 03-18-2013 . Diabetes Father     Social History   Socioeconomic History  . Marital status: Single    Spouse name: Not on file  . Number of children: Not  on file  . Years of education: Not on file  . Highest education level: Not on file  Occupational History  . Not on file  Tobacco Use  . Smoking status: Current Every Day Smoker    Types: Cigarettes  . Smokeless tobacco: Never Used  Vaping Use  . Vaping Use: Never used  Substance and Sexual Activity  . Alcohol use: Yes    Comment: occasionally  . Drug use: No  . Sexual activity: Not Currently    Birth control/protection: Pill  Other Topics Concern  . Not on file  Social History Narrative  . Not on file   Social Determinants of Health   Financial Resource Strain:   . Difficulty of Paying Living Expenses: Not on file  Food Insecurity:   . Worried About Programme researcher, broadcasting/film/video in the Last Year: Not on file  . Ran Out of Food in the Last Year: Not on file  Transportation Needs:   . Lack of Transportation (Medical): Not on file  . Lack of Transportation (Non-Medical): Not on file  Physical Activity:   . Days of Exercise per Week: Not on file  . Minutes of Exercise per Session: Not on file  Stress:   . Feeling of Stress : Not on file  Social Connections:   . Frequency of Communication  with Friends and Family: Not on file  . Frequency of Social Gatherings with Friends and Family: Not on file  . Attends Religious Services: Not on file  . Active Member of Clubs or Organizations: Not on file  . Attends Banker Meetings: Not on file  . Marital Status: Not on file  Intimate Partner Violence:   . Fear of Current or Ex-Partner: Not on file  . Emotionally Abused: Not on file  . Physically Abused: Not on file  . Sexually Abused: Not on file   Review of Systems  Constitutional: Negative for chills, diaphoresis and fatigue.  HENT: Negative for ear pain, postnasal drip and sinus pressure.   Eyes: Negative for photophobia, discharge, redness, itching and visual disturbance.  Respiratory: Negative for cough, shortness of breath and wheezing.   Cardiovascular: Negative for  chest pain, palpitations and leg swelling.  Gastrointestinal: Negative for abdominal pain, constipation, diarrhea, nausea and vomiting.  Genitourinary: Negative for dysuria and flank pain.  Musculoskeletal: Negative for arthralgias, back pain, gait problem and neck pain.  Skin: Negative for color change.  Allergic/Immunologic: Negative for environmental allergies and food allergies.  Neurological: Negative for dizziness and headaches.  Hematological: Does not bruise/bleed easily.  Psychiatric/Behavioral: Negative for agitation, behavioral problems (depression) and hallucinations.    Vital Signs: BP 136/88   Pulse 75   Temp (!) 97.5 F (36.4 C)   Resp 16   Ht 5\' 4"  (1.626 m)   Wt 239 lb 3.2 oz (108.5 kg)   LMP 08/27/2020 (Exact Date)   SpO2 99%   BMI 41.06 kg/m    Physical Exam Vitals reviewed.  Constitutional:      Appearance: Normal appearance. She is obese.  Cardiovascular:     Rate and Rhythm: Normal rate and regular rhythm.     Pulses: Normal pulses.     Heart sounds: Normal heart sounds.  Pulmonary:     Effort: Pulmonary effort is normal.     Breath sounds: Normal breath sounds.  Skin:    General: Skin is warm.  Neurological:     General: No focal deficit present.     Mental Status: She is alert and oriented to person, place, and time. Mental status is at baseline.  Psychiatric:        Mood and Affect: Mood normal.        Behavior: Behavior normal.        Thought Content: Thought content normal.    Assessment/Plan: 1. COVID-19 virus infection Resolved, no further symptoms, out of quarantine timeframe.  2. Need for COVID-19 vaccine Discussed the COVID19 vaccine, she is eligible for the vaccine at anytime now that her acute illness has resolved, she was not treated with immunotherapy.   3. Generalized anxiety disorder Symptoms remain well controlled on Buspar, continue with routine monitoring.  4. Morbid obesity (HCC) Discussed the importance of weight  management through healthy eating habits as well as incorporating exercise into her daily routine. Obesity Counseling: Risk Assessment: An assessment of behavioral risk factors was made today and includes lack of exercise sedentary lifestyle, lack of portion control and poor dietary habits.  Risk Modification Advice: She was counseled on portion control guidelines. Restricting daily caloric intake. The detrimental long term effects of obesity on her health and ongoing poor compliance was also discussed with the patient.  5. Cigarette nicotine dependence without complication Discussed the importance smoking cessation, she is not interested at this time on medication therapy to help with cessation. Smoking cessation counseling:  1. Pt acknowledges the risks of long term smoking, she will try to quite smoking. 2. Options for different medications including nicotine products, chewing gum, patch etc, Wellbutrin and Chantix is discussed 3. Goal and date of compete cessation is discussed 4. Total time spent in smoking cessation is 15 min.   6. Encounter for routine adult health examination with abnormal findings Labs ordered for upcoming CPE - CBC w/Diff/Platelet - Comprehensive Metabolic Panel (CMET) - Lipid Panel w/o Chol/HDL Ratio - TSH + free T4 - B12  General Counseling: Keali verbalizes understanding of the findings of todays visit and agrees with plan of treatment. I have discussed any further diagnostic evaluation that may be needed or ordered today. We also reviewed her medications today. she has been encouraged to call the office with any questions or concerns that should arise related to todays visit.    Orders Placed This Encounter  Procedures  . CBC w/Diff/Platelet  . Comprehensive Metabolic Panel (CMET)  . Lipid Panel w/o Chol/HDL Ratio  . TSH + free T4  . B12      Time spent: 30 Minutes Time spent includes review of chart, medications, test results and follow-up plan  with the patient.  This patient was seen by Leeanne Deed AGNP-C in Collaboration with Dr [name] as a part of collaborative care agreement     Lubertha Basque. Brendyn Mclaren AGNP-C Internal medicine

## 2020-09-21 ENCOUNTER — Encounter: Payer: Self-pay | Admitting: Hospice and Palliative Medicine

## 2020-10-14 ENCOUNTER — Ambulatory Visit (INDEPENDENT_AMBULATORY_CARE_PROVIDER_SITE_OTHER): Payer: BLUE CROSS/BLUE SHIELD | Admitting: Obstetrics and Gynecology

## 2020-10-14 ENCOUNTER — Other Ambulatory Visit: Payer: Self-pay

## 2020-10-14 ENCOUNTER — Other Ambulatory Visit (HOSPITAL_COMMUNITY)
Admission: RE | Admit: 2020-10-14 | Discharge: 2020-10-14 | Disposition: A | Discharge status: Home / Self Care | Payer: BLUE CROSS/BLUE SHIELD | Source: Ambulatory Visit | Attending: Obstetrics and Gynecology | Admitting: Obstetrics and Gynecology

## 2020-10-14 ENCOUNTER — Encounter: Payer: Self-pay | Admitting: Obstetrics and Gynecology

## 2020-10-14 VITALS — BP 126/84 | Wt 228.0 lb

## 2020-10-14 DIAGNOSIS — Z202 Contact with and (suspected) exposure to infections with a predominantly sexual mode of transmission: Secondary | ICD-10-CM | POA: Diagnosis not present

## 2020-10-14 DIAGNOSIS — N760 Acute vaginitis: Secondary | ICD-10-CM

## 2020-10-14 DIAGNOSIS — B9689 Other specified bacterial agents as the cause of diseases classified elsewhere: Secondary | ICD-10-CM | POA: Diagnosis not present

## 2020-10-14 MED ORDER — METRONIDAZOLE 500 MG PO TABS: 500.0000 mg | ORAL_TABLET | Freq: Two times a day (BID) | ORAL | 0 refills | Status: AC

## 2020-10-14 NOTE — Progress Notes (Signed)
Obstetrics & Gynecology Office Visit   Chief Complaint  Patient presents with  . Vaginitis   History of Present Illness: [name] is a 34 y.o. [phone] who LMP was No LMP recorded., presents today for a problem visit.   Patient complains of an abnormal vaginal discharge for 3 day. Discharge described as: white and thick. Vaginal symptoms include local irritation.Vulvar symptoms include vulvar itching.STI Risk: concern due to boyfriend stating he was unfaithful.   Other associated symptoms: none.Menstrual pattern:  Contraception: OCP (estrogen/progesterone).  She denies recent antibiotic exposure, denies changes in soaps, detergents coinciding with the onset of her symptoms.  She has not previously self treated or been under treatment by another provider for these symptoms.    Past Medical History:  Diagnosis Date  . Anxiety and depression   . Ectopic pregnancy   . Obesity   . Recurrent vaginitis    Past Surgical History:  Procedure Laterality Date  . ADENOIDECTOMY  1986  . DILATION AND CURETTAGE OF UTERUS  2009-03-19  . ECTOPIC PREGNANCY SURGERY  03/19/05   Dr Toya Smothers  . TONSILLECTOMY  03-19-2006   Gynecologic History: No LMP recorded.  Obstetric History: [id number]  Family History  Problem Relation Age of Onset  . Diabetes Mellitus II Mother   . COPD Mother   . Arthritis Mother   . Stroke Father   . Hypertension Father   . Brain cancer Father        meningioma-died 03-19-2013 . Diabetes Father     Social History   Socioeconomic History  . Marital status: Single    Spouse name: Not on file  . Number of children: Not on file  . Years of education: Not on file  . Highest education level: Not on file  Occupational History  . Not on file  Tobacco Use  . Smoking status: Current Every Day Smoker    Types: Cigarettes  . Smokeless tobacco: Never Used  Vaping Use  . Vaping Use: Never used  Substance and Sexual Activity  . Alcohol use: Yes    Comment: occasionally    . Drug use: No  . Sexual activity: Not Currently    Birth control/protection: Pill  Other Topics Concern  . Not on file  Social History Narrative  . Not on file   Social Determinants of Health   Financial Resource Strain:   . Difficulty of Paying Living Expenses: Not on file  Food Insecurity:   . Worried About Programme researcher, broadcasting/film/video in the Last Year: Not on file  . Ran Out of Food in the Last Year: Not on file  Transportation Needs:   . Lack of Transportation (Medical): Not on file  . Lack of Transportation (Non-Medical): Not on file  Physical Activity:   . Days of Exercise per Week: Not on file  . Minutes of Exercise per Session: Not on file  Stress:   . Feeling of Stress : Not on file  Social Connections:   . Frequency of Communication with Friends and Family: Not on file  . Frequency of Social Gatherings with Friends and Family: Not on file  . Attends Religious Services: Not on file  . Active Member of Clubs or Organizations: Not on file  . Attends Banker Meetings: Not on file  . Marital Status: Not on file  Intimate Partner Violence:   . Fear of Current or Ex-Partner: Not on file  . Emotionally Abused: Not on file  . Physically Abused:  Not on file  . Sexually Abused: Not on file    No Known Allergies  Prior to Admission medications   Medication Sig Start Date End Date Taking? Authorizing Provider  busPIRone (BUSPAR) 10 MG tablet TAKE 1/2 TO 1 TABLET BY MOUTH TWICE DAILY AS NEEDED FOR ANXIETY 10/23/19  Yes [name], MD  JUNEL FE 1/20 1-20 MG-MCG tablet Take 1 tab po as directed 09/13/20  Yes Copland, Ilona Sorrel, PA-C    Review of Systems  Constitutional: Negative.   HENT: Negative.   Eyes: Negative.   Respiratory: Negative.   Cardiovascular: Negative.   Gastrointestinal: Negative.   Genitourinary: Negative.   Musculoskeletal: Negative.   Skin: Negative.   Neurological: Negative.   Psychiatric/Behavioral: Negative.      Physical Exam BP  126/84   Wt 228 lb (103.4 kg)   BMI 39.14 kg/m  No LMP recorded. Physical Exam Constitutional:      General: She is not in acute distress.    Appearance: Normal appearance. She is well-developed.  Genitourinary:     Pelvic exam was performed with patient in the lithotomy position.     Vulva, inguinal canal, urethra, bladder, vagina, uterus, right adnexa and left adnexa normal.     No posterior fourchette tenderness, injury or lesion present.     No vaginal discharge.     No cervical motion tenderness, friability, lesion, bleeding or polyp.     Uterus is not tender or irregular.     Right adnexa not tender.     Left adnexa not tender.  HENT:     Head: Normocephalic and atraumatic.  Eyes:     General: No scleral icterus.    Conjunctiva/sclera: Conjunctivae normal.  Cardiovascular:     Rate and Rhythm: Normal rate and regular rhythm.     Heart sounds: No murmur heard.  No friction rub. No gallop.   Pulmonary:     Effort: Pulmonary effort is normal. No respiratory distress.     Breath sounds: Normal breath sounds. No wheezing or rales.  Abdominal:     General: Bowel sounds are normal. There is no distension.     Palpations: Abdomen is soft. There is no mass.     Tenderness: There is no abdominal tenderness. There is no guarding or rebound.  Musculoskeletal:        General: Normal range of motion.     Cervical back: Normal range of motion and neck supple.  Neurological:     General: No focal deficit present.     Mental Status: She is alert and oriented to person, place, and time.     Cranial Nerves: No cranial nerve deficit.  Skin:    General: Skin is warm and dry.     Findings: No erythema.  Psychiatric:        Mood and Affect: Mood normal.        Behavior: Behavior normal.        Judgment: Judgment normal.   Female chaperone present for pelvic and breast  portions of the physical exam  Wet Prep: PH: 5 Clue Cells: Positive Fungal elements: Negative Trichomonas:  Negative  Assessment: 34 y.o. [id number] female here for  1. Bacterial vaginitis   2. Possible exposure to STD     Plan: Problem List Items Addressed This Visit      Genitourinary   Bacterial vaginitis - Primary   Relevant Medications   metroNIDAZOLE (FLAGYL) 500 MG tablet    Other Visit Diagnoses  Possible exposure to STD       Relevant Orders   Cervicovaginal ancillary only     Rx for flagyl for BV Send NAAT for GC/CT/Trich  Thomasene Mohair, MD 10/14/2020 4:25 PM

## 2020-10-16 LAB — CERVICOVAGINAL ANCILLARY ONLY
Chlamydia: NEGATIVE
Comment: NEGATIVE
Comment: NEGATIVE
Comment: NORMAL
Neisseria Gonorrhea: NEGATIVE
Trichomonas: NEGATIVE

## 2020-12-18 ENCOUNTER — Other Ambulatory Visit: Payer: Self-pay | Admitting: Obstetrics and Gynecology

## 2020-12-18 DIAGNOSIS — Z3041 Encounter for surveillance of contraceptive pills: Secondary | ICD-10-CM

## 2020-12-19 ENCOUNTER — Encounter: Payer: Self-pay | Admitting: Physician Assistant

## 2020-12-19 ENCOUNTER — Other Ambulatory Visit: Payer: Self-pay

## 2020-12-19 ENCOUNTER — Ambulatory Visit (INDEPENDENT_AMBULATORY_CARE_PROVIDER_SITE_OTHER): Payer: BLUE CROSS/BLUE SHIELD | Admitting: Physician Assistant

## 2020-12-19 DIAGNOSIS — R300 Dysuria: Secondary | ICD-10-CM

## 2020-12-19 DIAGNOSIS — Z0001 Encounter for general adult medical examination with abnormal findings: Secondary | ICD-10-CM

## 2020-12-19 DIAGNOSIS — F411 Generalized anxiety disorder: Secondary | ICD-10-CM

## 2020-12-19 DIAGNOSIS — E669 Obesity, unspecified: Secondary | ICD-10-CM

## 2020-12-19 DIAGNOSIS — F339 Major depressive disorder, recurrent, unspecified: Secondary | ICD-10-CM

## 2020-12-19 DIAGNOSIS — Z20822 Contact with and (suspected) exposure to covid-19: Secondary | ICD-10-CM

## 2020-12-19 DIAGNOSIS — F17210 Nicotine dependence, cigarettes, uncomplicated: Secondary | ICD-10-CM

## 2020-12-19 DIAGNOSIS — F3341 Major depressive disorder, recurrent, in partial remission: Secondary | ICD-10-CM

## 2020-12-19 MED ORDER — BUSPIRONE HCL 10 MG PO TABS: ORAL_TABLET | ORAL | 1 refills | Status: DC

## 2020-12-19 NOTE — Progress Notes (Signed)
[facility] [address] [place], [place] [address]  Internal MEDICINE  Office Visit Note  Patient Name: Marilyn Li  [id number]  [id number]  Date of Service: 12/20/2020  Chief Complaint  Patient presents with  . Annual Exam    Refill request  . Depression  . Anxiety     HPI Pt is here for routine health maintenance examination. She did not get her lab work done yet. 1 of her kids is positive for covid and she has been experiencing headaches and congestion the past few days. Wants to do covid test today, which was negative. Pt is vaccinated, but wants to get tested. Had covid in sept and went on prednisone and abx. Otherwise, she is doing well. Depression: Emotional, but has been writing positive messages to help. Medication expired so wants to restart. Pt has lost some weight. Working on diet and exercise. Has been doing work outs at home.  Current Medication: Outpatient Encounter Medications as of 12/19/2020  Medication Sig  . JUNEL FE 1/20 1-20 MG-MCG tablet Take 1 tab po as directed  . [DISCONTINUED] busPIRone (BUSPAR) 10 MG tablet TAKE 1/2 TO 1 TABLET BY MOUTH TWICE DAILY AS NEEDED FOR ANXIETY  . busPIRone (BUSPAR) 10 MG tablet TAKE 1/2 TO 1 TABLET BY MOUTH TWICE DAILY AS NEEDED FOR ANXIETY   No facility-administered encounter medications on file as of 12/19/2020.    Surgical History: Past Surgical History:  Procedure Laterality Date  . ADENOIDECTOMY  1986  . DILATION AND CURETTAGE OF UTERUS  Mar 24, 2009  . ECTOPIC PREGNANCY SURGERY  03-24-05   Dr Toya Smothers  . TONSILLECTOMY  03-24-2006    Medical History: Past Medical History:  Diagnosis Date  . Anxiety and depression   . Ectopic pregnancy   . Obesity   . Recurrent vaginitis     Family History: Family History  Problem Relation Age of Onset  . Diabetes Mellitus II Mother   . COPD Mother   . Arthritis Mother   . Stroke Father   . Hypertension Father   . Brain cancer Father         meningioma-died March 24, 2013 . Diabetes Father       Review of Systems  Constitutional: Negative for appetite change, chills, diaphoresis, fatigue, fever and unexpected weight change.  HENT: Positive for congestion. Negative for ear pain, postnasal drip, rhinorrhea, sinus pressure, sinus pain and sore throat.   Eyes: Negative for photophobia, discharge, redness, itching and visual disturbance.  Respiratory: Positive for cough. Negative for shortness of breath and wheezing.   Cardiovascular: Negative for chest pain, palpitations and leg swelling.  Gastrointestinal: Negative for abdominal pain, constipation, diarrhea, nausea and vomiting.  Genitourinary: Negative for dysuria and flank pain.  Musculoskeletal: Negative for arthralgias, back pain, gait problem and neck pain.  Skin: Negative.  Negative for color change.  Allergic/Immunologic: Negative for environmental allergies and food allergies.  Neurological: Positive for headaches. Negative for dizziness.  Hematological: Does not bruise/bleed easily.  Psychiatric/Behavioral: Negative for agitation, behavioral problems (depression) and hallucinations.     Vital Signs: BP 138/80   Pulse 90   Temp 98 F (36.7 C)   Resp 16   Ht 5\' 4"  (1.626 m)   Wt 216 lb 6.4 oz (98.2 kg)   SpO2 98%   BMI 37.14 kg/m    Physical Exam Constitutional:      General: She is not in acute distress.    Appearance: She is obese.  HENT:  Head: Normocephalic and atraumatic.     Right Ear: Tympanic membrane normal.     Left Ear: Tympanic membrane normal.     Nose: Nose normal.     Mouth/Throat:     Mouth: Mucous membranes are moist.  Eyes:     Extraocular Movements: Extraocular movements intact.     Pupils: Pupils are equal, round, and reactive to light.  Cardiovascular:     Rate and Rhythm: Normal rate and regular rhythm.     Pulses: Normal pulses.     Heart sounds: Normal heart sounds.  Pulmonary:     Effort: Pulmonary effort is normal.      Breath sounds: Normal breath sounds.  Abdominal:     General: Abdomen is flat. Bowel sounds are normal.     Palpations: Abdomen is soft.  Musculoskeletal:        General: Normal range of motion.     Cervical back: Normal range of motion.  Skin:    General: Skin is warm and dry.  Neurological:     General: No focal deficit present.     Mental Status: She is alert.  Psychiatric:     Comments: Patient is anxious and depressed      LABS: Recent Results (from the past 2160 hour(s))  Cervicovaginal ancillary only     Status: None   Collection Time: 10/14/20  4:23 PM  Result Value Ref Range   Trichomonas Negative    Chlamydia Negative    Neisseria Gonorrhea Negative    Comment Normal Reference Range Trichomonas - Negative    Comment Normal Reference Ranger Chlamydia - Negative    Comment      Normal Reference Range Neisseria Gonorrhea - Negative  UA/M w/rflx Culture, Routine     Status: Abnormal   Collection Time: 12/19/20  3:15 PM   Specimen: Urine   Urine  Result Value Ref Range   Specific Gravity, UA      >=1.030 (A) 1.005 - 1.030   pH, UA 6.0 5.0 - 7.5   Color, UA Yellow Yellow   Appearance Ur Clear Clear   Leukocytes,UA Negative Negative   Protein,UA Trace Negative/Trace   Glucose, UA Negative Negative   Ketones, UA 2+ (A) Negative   RBC, UA 1+ (A) Negative   Bilirubin, UA Negative Negative   Urobilinogen, Ur 1.0 0.2 - 1.0 mg/dL   Nitrite, UA Negative Negative   Microscopic Examination See below:     Comment: Microscopic was indicated and was performed.   Urinalysis Reflex Comment     Comment: This specimen will not reflex to a Urine Culture.  Microscopic Examination     Status: Abnormal   Collection Time: 12/19/20  3:15 PM   Urine  Result Value Ref Range   WBC, UA 0-5 0 - 5 /hpf   RBC 11-30 (A) 0 - 2 /hpf   Epithelial Cells (non renal) 0-10 0 - 10 /hpf   Casts None seen None seen /lpf   Bacteria, UA None seen None seen/Few     .MAMM    Assessment/Plan: 1. Encounter for general adult medical examination with abnormal findings Age appropriate preventive labs, diagnostics updated   2. Depression, recurrent (HCC) Restart buspirone. It helped her previously. Will f/u in 6 weeks. - busPIRone (BUSPAR) 10 MG tablet; TAKE 1/2 TO 1 TABLET BY MOUTH TWICE DAILY AS NEEDED FOR ANXIETY  Dispense: 180 tablet; Refill: 1  3. Close exposure to COVID-19 virus Daughter has covid and pt is having headaches and  congestion. Test in office was negative. Advised to take tylenol and mucinex for her congestion and to f/u with [place] if not improving.  4. Dysuria Routine showed some ketones and RBC. Will repeat at next visit. - UA/M w/rflx Culture, Routine  5. Generalized anxiety disorder Will restart Buspirone and f/u in 6 weeks. - busPIRone (BUSPAR) 10 MG tablet; TAKE 1/2 TO 1 TABLET BY MOUTH TWICE DAILY AS NEEDED FOR ANXIETY  Dispense: 180 tablet; Refill: 1  6. Obesity (BMI 35.0-39.9 without comorbidity) Will continue working on diet and exercise with home work outs. Has been losing weight on her own.  7. Cigarette nicotine dependence without complication Patient is down to .25ppd. Smoking cessation counseling: 1. Pt acknowledges the risks of long term smoking, she will try to quite smoking. 2. Options for different medications including nicotine products, chewing gum, patch etc, Wellbutrin and Chantix is discussed 3. Goal and date of compete cessation is discussed 4. Total time spent in smoking cessation is 10 min.   General Counseling: Kaavya verbalizes understanding of the findings of todays visit and agrees with plan of treatment. I have discussed any further diagnostic evaluation that may be needed or ordered today. We also reviewed her medications today. she has been encouraged to call the office with any questions or concerns that should arise related to todays visit.    Counseling:    Orders Placed This  Encounter  Procedures  . Microscopic Examination  . UA/M w/rflx Culture, Routine  . POC SOFIA 2 FLU + SARS ANTIGEN FIA    Meds ordered this encounter  Medications  . busPIRone (BUSPAR) 10 MG tablet    Sig: TAKE 1/2 TO 1 TABLET BY MOUTH TWICE DAILY AS NEEDED FOR ANXIETY    Dispense:  180 tablet    Refill:  1    Total time spent:30 Minutes  Time spent includes review of chart, medications, test results, and follow up plan with the patient.     [name], MD  Internal Medicine

## 2020-12-20 LAB — UA/M W/RFLX CULTURE, ROUTINE
Bilirubin, UA: NEGATIVE
Glucose, UA: NEGATIVE
Leukocytes,UA: NEGATIVE
Nitrite, UA: NEGATIVE
Specific Gravity, UA: >=1.03 — AB (ref 1.005–1.030)
Urobilinogen, Ur: 1 mg/dL (ref 0.2–1.0)
pH, UA: 6 (ref 5.0–7.5)

## 2020-12-20 LAB — MICROSCOPIC EXAMINATION
Bacteria, UA: NONE SEEN
Casts: NONE SEEN /lpf

## 2020-12-20 NOTE — Progress Notes (Signed)
Reviewed. Will repeat at next visit.

## 2020-12-26 LAB — POC SOFIA 2 FLU + SARS ANTIGEN FIA
Influenza A, POC: NEGATIVE
Influenza B, POC: NEGATIVE
SARS Coronavirus 2 Ag: NEGATIVE

## 2021-01-03 ENCOUNTER — Ambulatory Visit: Payer: BLUE CROSS/BLUE SHIELD | Admitting: Obstetrics and Gynecology

## 2021-01-03 ENCOUNTER — Other Ambulatory Visit: Payer: Self-pay

## 2021-01-03 ENCOUNTER — Encounter: Payer: Self-pay | Admitting: Obstetrics

## 2021-01-03 ENCOUNTER — Ambulatory Visit (INDEPENDENT_AMBULATORY_CARE_PROVIDER_SITE_OTHER): Payer: BLUE CROSS/BLUE SHIELD | Admitting: Obstetrics

## 2021-01-03 VITALS — BP 120/80 | HR 86 | Ht 64.0 in | Wt 225.0 lb

## 2021-01-03 DIAGNOSIS — R109 Unspecified abdominal pain: Secondary | ICD-10-CM

## 2021-01-03 DIAGNOSIS — R350 Frequency of micturition: Secondary | ICD-10-CM | POA: Diagnosis not present

## 2021-01-03 DIAGNOSIS — R300 Dysuria: Secondary | ICD-10-CM

## 2021-01-03 DIAGNOSIS — R319 Hematuria, unspecified: Secondary | ICD-10-CM

## 2021-01-03 LAB — POCT URINALYSIS DIPSTICK
Appearance: NORMAL
Bilirubin, UA: NEGATIVE
Blood, UA: 50
Glucose, UA: NEGATIVE
Ketones, UA: NEGATIVE
Leukocytes, UA: NEGATIVE
Nitrite, UA: NEGATIVE
Odor: NORMAL
Protein, UA: NEGATIVE
Spec Grav, UA: 1.02 (ref 1.010–1.025)
Urobilinogen, UA: 0.2 E.U./dL
pH, UA: 6 (ref 5.0–8.0)

## 2021-01-03 NOTE — Progress Notes (Signed)
Obstetrics & Gynecology Office Visit   Chief Complaint:  Chief Complaint  Patient presents with  . Urinary Tract Infection    Urge/frequency w/little output/left flank pain x 3 days    History of Present Illness: Marilyn Li presents c/o Left sided flank pain x 4 days that comes and goes.She has also noticed increased urgency to void, but does not produce ample urine. She descries the pain as "colicky" and at times it is more painful than at others. She has not treated with anything other than Tylenol, which has not really helped.  She denies any major changes in her health recently. She denies any dysuria or any visible blood in her urine.   Review of Systems:  Review of Systems  Constitutional: Negative.   Eyes: Negative.   Gastrointestinal: Negative.   Genitourinary: Positive for flank pain, frequency and urgency.  Skin: Negative.   Neurological: Negative.   Endo/Heme/Allergies: Negative.   Psychiatric/Behavioral: Negative.      Past Medical History:  Past Medical History:  Diagnosis Date  . Anxiety and depression   . Ectopic pregnancy   . Obesity   . Recurrent vaginitis     Past Surgical History:  Past Surgical History:  Procedure Laterality Date  . ADENOIDECTOMY  1986  . DILATION AND CURETTAGE OF UTERUS  03-26-2009  . ECTOPIC PREGNANCY SURGERY  Mar 26, 2005   Dr Toya Smothers  . TONSILLECTOMY  03-26-2006    Gynecologic History: Patient's last menstrual period was 12/16/2020.  Obstetric History: [id number]  Family History:  Family History  Problem Relation Age of Onset  . Diabetes Mellitus II Mother   . COPD Mother   . Arthritis Mother   . Stroke Father   . Hypertension Father   . Brain cancer Father        meningioma-died 03/26/13 . Diabetes Father     Social History:  Social History   Socioeconomic History  . Marital status: Single    Spouse name: Not on file  . Number of children: Not on file  . Years of education: Not on file  . Highest education level: Not  on file  Occupational History  . Not on file  Tobacco Use  . Smoking status: Current Every Day Smoker    Types: Cigarettes  . Smokeless tobacco: Never Used  Vaping Use  . Vaping Use: Never used  Substance and Sexual Activity  . Alcohol use: Yes    Comment: occasionally  . Drug use: No  . Sexual activity: Not Currently    Birth control/protection: Pill  Other Topics Concern  . Not on file  Social History Narrative  . Not on file   Social Determinants of Health   Financial Resource Strain: Not on file  Food Insecurity: Not on file  Transportation Needs: Not on file  Physical Activity: Not on file  Stress: Not on file  Social Connections: Not on file  Intimate Partner Violence: Not on file    Allergies:  No Known Allergies  Medications: Prior to Admission medications   Medication Sig Start Date End Date Taking? Authorizing Provider  busPIRone (BUSPAR) 10 MG tablet TAKE 1/2 TO 1 TABLET BY MOUTH TWICE DAILY AS NEEDED FOR ANXIETY 12/19/20  Yes [name], MD  JUNEL FE 1/20 1-20 MG-MCG tablet Take 1 tab po as directed 09/13/20  Yes Copland, Ilona Sorrel, PA-C    Physical Exam Vitals:  Vitals:   01/03/21 1522  BP: 120/80  Pulse: 86   Patient's last menstrual  period was 12/16/2020. Limited physical exam: Physical Exam Constitutional:      Appearance: Normal appearance. She is obese.  HENT:     Head: Normocephalic and atraumatic.     Nose: Nose normal.  Cardiovascular:     Rate and Rhythm: Normal rate and regular rhythm.  Genitourinary:    Comments: deferred Musculoskeletal:        General: Normal range of motion.  Skin:    General: Skin is warm and dry.  Neurological:     General: No focal deficit present.     Mental Status: She is alert and oriented to person, place, and time.  Psychiatric:        Mood and Affect: Mood normal.        Behavior: Behavior normal.   Urine dipstick shows ketones 2+, spec grav:>1.030; 1+ blood, Negative Nitrites, negative leuks,  negative bacteria  Lab Orders     Urine Culture     POCT Urinalysis Dipstick  Assessment: 35 y.o. [id number]  With left flank pain and hematuria, ketonuria   Plan: Problem List Items Addressed This Visit      Other   Dysuria   Relevant Orders   Urine Culture    Other Visit Diagnoses    Urine frequency    -  Primary   Relevant Orders   POCT Urinalysis Dipstick (Completed)   Urine Culture   US RENAL   Hematuria, unspecified type       Relevant Orders   US RENAL   Acute left flank pain       Relevant Orders   US RENAL     Renal sono ordered for her. We discussed her findings that are not suspcious for a UTI or pyelonephritis.  We will treat based on the renal ultrasound and refer to Urology as needed based on her scan. Advised her to increase her water intake OTC Tyleonol or Ibuprofen for pain. Advised to seek treatment should she develop a fever or intense flank pain. She verbalizes understanding. RTC PRN.  Mirna Mires, CNM  01/03/2021 5:44 PM

## 2021-01-05 ENCOUNTER — Encounter: Payer: Self-pay | Admitting: Obstetrics

## 2021-01-05 LAB — URINE CULTURE

## 2021-01-09 ENCOUNTER — Ambulatory Visit: Admission: RE | Admit: 2021-01-09 | Payer: BLUE CROSS/BLUE SHIELD | Source: Ambulatory Visit

## 2021-01-17 ENCOUNTER — Ambulatory Visit
Admission: RE | Admit: 2021-01-17 | Discharge: 2021-01-17 | Disposition: A | Discharge status: Home / Self Care | Payer: BLUE CROSS/BLUE SHIELD | Source: Ambulatory Visit | Attending: Obstetrics | Admitting: Obstetrics

## 2021-01-17 ENCOUNTER — Other Ambulatory Visit: Payer: Self-pay

## 2021-01-17 DIAGNOSIS — R109 Unspecified abdominal pain: Secondary | ICD-10-CM | POA: Insufficient documentation

## 2021-01-17 DIAGNOSIS — R319 Hematuria, unspecified: Secondary | ICD-10-CM | POA: Diagnosis present

## 2021-01-17 DIAGNOSIS — R350 Frequency of micturition: Secondary | ICD-10-CM | POA: Diagnosis present

## 2021-01-20 ENCOUNTER — Other Ambulatory Visit: Payer: Self-pay | Admitting: Obstetrics

## 2021-01-20 DIAGNOSIS — R109 Unspecified abdominal pain: Secondary | ICD-10-CM

## 2021-01-20 NOTE — Progress Notes (Signed)
amb  

## 2021-01-22 ENCOUNTER — Ambulatory Visit (INDEPENDENT_AMBULATORY_CARE_PROVIDER_SITE_OTHER): Payer: BLUE CROSS/BLUE SHIELD | Admitting: Urology

## 2021-01-22 ENCOUNTER — Encounter: Payer: Self-pay | Admitting: Urology

## 2021-01-22 ENCOUNTER — Other Ambulatory Visit: Payer: Self-pay

## 2021-01-22 VITALS — BP 138/85 | HR 80 | Ht 64.0 in | Wt 220.0 lb

## 2021-01-22 DIAGNOSIS — R3129 Other microscopic hematuria: Secondary | ICD-10-CM

## 2021-01-22 DIAGNOSIS — R109 Unspecified abdominal pain: Secondary | ICD-10-CM

## 2021-01-22 LAB — URINALYSIS, COMPLETE
Bilirubin, UA: NEGATIVE
Glucose, UA: NEGATIVE
Ketones, UA: NEGATIVE
Leukocytes,UA: NEGATIVE
Nitrite, UA: NEGATIVE
Protein,UA: NEGATIVE
Specific Gravity, UA: 1.025 (ref 1.005–1.030)
Urobilinogen, Ur: 0.2 mg/dL (ref 0.2–1.0)
pH, UA: 6.5 (ref 5.0–7.5)

## 2021-01-22 LAB — MICROSCOPIC EXAMINATION
Bacteria, UA: NONE SEEN
WBC, UA: NONE SEEN /hpf (ref 0–5)

## 2021-01-22 NOTE — Progress Notes (Signed)
01/22/2021 3:45 PM   Marilyn Li March 05, 1986 [id number]  Referring provider: Mirna Mires, CNM [address] [place],  [place] [address]  Chief Complaint  Patient presents with   Flank Pain    HPI: Marilyn Li is a 35 y.o. female referred for left flank pain.   Seen at [facility] 01/03/2021 with a 4-day history of left flank pain that was intermittent and associated with increased urgency and voiding small amounts  Dipstick urine showed 1+ blood; urine culture negative  Saw her PCP 12/19/2020 for routine visit and urinalysis showed 1+ blood on dipstick with 11-30 RBCs on microscopy  RUS ordered and performed last week which showed no urinary calculi or hydronephrosis.  Her pain has significantly improved and she notes mild flank pain, occasionally bilateral  Urinary frequency and urgency has improved   PMH: Past Medical History:  Diagnosis Date   Anxiety and depression    Ectopic pregnancy    Obesity    Recurrent vaginitis     Surgical History: Past Surgical History:  Procedure Laterality Date   ADENOIDECTOMY  59   DILATION AND CURETTAGE OF UTERUS  2010   ECTOPIC PREGNANCY SURGERY  03/17/05   Dr Toya Smothers   TONSILLECTOMY  2006/03/17    Home Medications:  Allergies as of 01/22/2021   No Known Allergies     Medication List       Accurate as of January 22, 2021  3:45 PM. If you have any questions, ask your nurse or doctor.        busPIRone 10 MG tablet Commonly known as: BUSPAR TAKE 1/2 TO 1 TABLET BY MOUTH TWICE DAILY AS NEEDED FOR ANXIETY   Junel FE 1/20 1-20 MG-MCG tablet Generic drug: norethindrone-ethinyl estradiol Take 1 tab po as directed       Allergies: No Known Allergies  Family History: Family History  Problem Relation Age of Onset   Diabetes Mellitus II Mother    COPD Mother    Arthritis Mother    Stroke Father    Hypertension Father    Brain cancer Father        meningioma-died 03-17-2013   Diabetes Father     Social History:  reports that she has been smoking cigarettes. She has never used smokeless tobacco. She reports current alcohol use. She reports that she does not use drugs.   Physical Exam: BP 138/85    Pulse 80    Ht 5\' 4"  (1.626 m)    Wt 220 lb (99.8 kg)    BMI 37.76 kg/m   Constitutional:  Alert and oriented, No acute distress. HEENT: [place] AT, moist mucus membranes.  Trachea midline, no masses. Cardiovascular: No clubbing, cyanosis, or edema. Respiratory: Normal respiratory effort, no increased work of breathing. Psychiatric: Normal mood and affect.  Laboratory Data:  Urinalysis Dipstick 2+ blood Microscopy 3-10 RBC  Pertinent Imaging: Images were personally reviewed and interpreted  RENAL  Narrative CLINICAL DATA:  Left flank pain, microscopic, difficulty voiding despite urge  EXAM: RENAL / URINARY TRACT ULTRASOUND COMPLETE  COMPARISON:  Ultrasound 04/06/2007  FINDINGS: Right Kidney:  Renal measurements: 10.7 x 4.3 x 5.7 cm = volume: 136.8 mL. Echogenicity is within normal limits. No concerning renal mass, shadowing calculus or hydronephrosis.  Left Kidney:  Renal measurements: 11.8 x 6.2 x 5.4 cm = volume: 207.1 mL. There appears to be a slightly hypertrophied/prominent column of Bertin which has a similar configuration to comparison ultrasound 04/06/2007. Alternatively, a duplicated collecting system  with cortical septum could have a similar appearance. Cortical echogenicity within normal limits. No concerning renal mass, shadowing calculus or hydronephrosis.  Bladder:  Appears normal for degree of bladder distention.  Other:  None.  IMPRESSION: 1. No acute renal abnormality. 2. 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   Electronically Signed By: Kreg Shropshire M.D. On: 01/19/2021 03:55    Assessment & Plan:     1. Flank pain  Intermediate risk microhematuria  Minimal but abnormal microhematuria today  We discussed possibility of a ureteral calculus which could be accounting for her symptoms and not necessarily identified on ultrasonic imaging  Recommend scheduling stone protocol CT of the abdomen pelvis  2.  Microhematuria  If CT does not identify a ureteral calculus recommend scheduling cystoscopy to complete her microhematuria evaluation   Riki Altes, MD  [facility] [address], [address] [place], [place] [address] [phone]

## 2021-01-30 ENCOUNTER — Ambulatory Visit: Payer: BLUE CROSS/BLUE SHIELD | Admitting: Physician Assistant

## 2021-02-05 ENCOUNTER — Other Ambulatory Visit: Payer: Self-pay | Admitting: Obstetrics and Gynecology

## 2021-02-05 ENCOUNTER — Encounter: Payer: Self-pay | Admitting: Obstetrics and Gynecology

## 2021-02-05 DIAGNOSIS — Z3041 Encounter for surveillance of contraceptive pills: Secondary | ICD-10-CM

## 2021-02-05 MED ORDER — JUNEL FE 1/20 1-20 MG-MCG PO TABS: ORAL_TABLET | ORAL | 2 refills | Status: DC

## 2021-02-05 NOTE — Progress Notes (Signed)
Rx RF OCPs till annual 10/22

## 2021-02-17 ENCOUNTER — Encounter: Payer: Self-pay | Admitting: Advanced Practice Midwife

## 2021-02-17 ENCOUNTER — Other Ambulatory Visit (HOSPITAL_COMMUNITY)
Admission: RE | Admit: 2021-02-17 | Discharge: 2021-02-17 | Disposition: A | Discharge status: Home / Self Care | Payer: BLUE CROSS/BLUE SHIELD | Source: Ambulatory Visit | Attending: Advanced Practice Midwife | Admitting: Advanced Practice Midwife

## 2021-02-17 ENCOUNTER — Ambulatory Visit: Payer: BLUE CROSS/BLUE SHIELD | Admitting: Physician Assistant

## 2021-02-17 ENCOUNTER — Ambulatory Visit (INDEPENDENT_AMBULATORY_CARE_PROVIDER_SITE_OTHER): Payer: BLUE CROSS/BLUE SHIELD | Admitting: Advanced Practice Midwife

## 2021-02-17 ENCOUNTER — Other Ambulatory Visit: Payer: Self-pay

## 2021-02-17 VITALS — BP 118/74 | Ht 64.0 in | Wt 221.0 lb

## 2021-02-17 DIAGNOSIS — N760 Acute vaginitis: Secondary | ICD-10-CM | POA: Diagnosis not present

## 2021-02-17 DIAGNOSIS — Z113 Encounter for screening for infections with a predominantly sexual mode of transmission: Secondary | ICD-10-CM | POA: Diagnosis present

## 2021-02-17 DIAGNOSIS — N898 Other specified noninflammatory disorders of vagina: Secondary | ICD-10-CM

## 2021-02-17 DIAGNOSIS — B9689 Other specified bacterial agents as the cause of diseases classified elsewhere: Secondary | ICD-10-CM | POA: Insufficient documentation

## 2021-02-17 DIAGNOSIS — Z6839 Body mass index (BMI) 39.0-39.9, adult: Secondary | ICD-10-CM | POA: Insufficient documentation

## 2021-02-17 DIAGNOSIS — D649 Anemia, unspecified: Secondary | ICD-10-CM | POA: Insufficient documentation

## 2021-02-17 DIAGNOSIS — E669 Obesity, unspecified: Secondary | ICD-10-CM | POA: Insufficient documentation

## 2021-02-17 HISTORY — DX: Anemia, unspecified: D64.9

## 2021-02-17 NOTE — Progress Notes (Signed)
Patient ID: Marilyn Li, female   DOB: Jul 25, 1986, 35 y.o.   MRN: [id number]  Reason for Consult: Vaginitis, Vaginal Discharge, and Exposure to STD (Wants STD tests, possible labs)    Subjective:  HPI:  Marilyn Li is a 35 y.o. female being seen for possible vaginitis/possible STD exposure. Her boyfriend informed her that he cheated on her several months ago and again recently. She usually uses condoms although did not use them most recently. She had STD blood work last year and declines that today. She currently has some vaginal discharge, itching and a slight odor. She denies irritation or urinary symptoms.  Past Medical History:  Diagnosis Date  . Anxiety and depression   . Ectopic pregnancy   . Obesity   . Recurrent vaginitis    Family History  Problem Relation Age of Onset  . Diabetes Mellitus II Mother   . COPD Mother   . Arthritis Mother   . Stroke Father   . Hypertension Father   . Brain cancer Father        meningioma-died 04-03-13 . Diabetes Father    Past Surgical History:  Procedure Laterality Date  . ADENOIDECTOMY  1986  . DILATION AND CURETTAGE OF UTERUS  Apr 03, 2009  . ECTOPIC PREGNANCY SURGERY  2005-04-03   Dr Toya Smothers  . TONSILLECTOMY  Apr 03, 2006    Short Social History:  Social History   Tobacco Use  . Smoking status: Current Every Day Smoker    Types: Cigarettes  . Smokeless tobacco: Never Used  Substance Use Topics  . Alcohol use: Yes    Comment: occasionally    No Known Allergies  Current Outpatient Medications  Medication Sig Dispense Refill  . busPIRone (BUSPAR) 10 MG tablet TAKE 1/2 TO 1 TABLET BY MOUTH TWICE DAILY AS NEEDED FOR ANXIETY 180 tablet 1  . JUNEL FE 1/20 1-20 MG-MCG tablet Take 1 tab po as directed 84 tablet 2   No current facility-administered medications for this visit.    Review of Systems  Constitutional: Negative for chills and fever.  HENT: Negative for congestion, ear discharge, ear pain, hearing loss, sinus  pain and sore throat.   Eyes: Negative for blurred vision and double vision.  Respiratory: Negative for cough, shortness of breath and wheezing.   Cardiovascular: Negative for chest pain, palpitations and leg swelling.  Gastrointestinal: Negative for abdominal pain, blood in stool, constipation, diarrhea, heartburn, melena, nausea and vomiting.  Genitourinary: Negative for dysuria, flank pain, frequency, hematuria and urgency.       Positive for vaginal itching, discharge and odor  Musculoskeletal: Negative for back pain, joint pain and myalgias.  Skin: Negative for itching and rash.  Neurological: Negative for dizziness, tingling, tremors, sensory change, speech change, focal weakness, seizures, loss of consciousness, weakness and headaches.  Endo/Heme/Allergies: Negative for environmental allergies. Does not bruise/bleed easily.  Psychiatric/Behavioral: Negative for depression, hallucinations, memory loss, substance abuse and suicidal ideas. The patient is not nervous/anxious and does not have insomnia.         Objective:  Objective   Vitals:   02/17/21 1537  BP: 118/74  Weight: 221 lb (100.2 kg)  Height: 5\' 4"  (1.626 m)   Body mass index is 37.93 kg/m. Constitutional: Well nourished, well developed female in no acute distress.  HEENT: normal Skin: Warm and dry.  Cardiovascular: Regular rate and rhythm.   Respiratory: Clear to auscultation bilateral. Normal respiratory effort Neuro: DTRs 2+, Cranial nerves grossly intact Psych: Alert and Oriented x3. No  memory deficits. Normal mood and affect.  MS: normal gait, normal bilateral lower extremity ROM/strength/stability.  Pelvic exam:  is not limited by body habitus EGBUS: within normal limits Vagina: within normal limits and with normal mucosa, menstrual blood in the vault, some clumpy white discharge present Cervix: not evaluated   Assessment/Plan:     35 y.o. G75 [id number] female with possible vaginitis/STD  testing   Aptima swab: vaginitis/STDs Follow up as needed after labs result Preferred medications if needed: metro gel, diflucan   Tresea Mall CNM [facility] 02/17/2021, 4:05 PM

## 2021-02-19 LAB — CERVICOVAGINAL ANCILLARY ONLY
Bacterial Vaginitis (gardnerella): POSITIVE — AB
Candida Glabrata: NEGATIVE
Candida Vaginitis: NEGATIVE
Chlamydia: NEGATIVE
Comment: NEGATIVE
Comment: NEGATIVE
Comment: NEGATIVE
Comment: NEGATIVE
Comment: NEGATIVE
Comment: NORMAL
Neisseria Gonorrhea: NEGATIVE
Trichomonas: NEGATIVE

## 2021-02-21 ENCOUNTER — Other Ambulatory Visit: Payer: Self-pay | Admitting: Advanced Practice Midwife

## 2021-02-21 DIAGNOSIS — B3731 Acute candidiasis of vulva and vagina: Secondary | ICD-10-CM

## 2021-02-21 DIAGNOSIS — N760 Acute vaginitis: Secondary | ICD-10-CM

## 2021-02-21 DIAGNOSIS — B9689 Other specified bacterial agents as the cause of diseases classified elsewhere: Secondary | ICD-10-CM

## 2021-02-21 DIAGNOSIS — B373 Candidiasis of vulva and vagina: Secondary | ICD-10-CM

## 2021-02-21 MED ORDER — FLUCONAZOLE 150 MG PO TABS: 150.0000 mg | ORAL_TABLET | Freq: Once | ORAL | 3 refills | Status: AC

## 2021-02-21 MED ORDER — METRONIDAZOLE 0.75 % VA GEL: 1.0000 | Freq: Every day | VAGINAL | 1 refills | Status: AC

## 2021-02-21 NOTE — Progress Notes (Signed)
Rx metro gel to treat BV Rx diflucan to treat in event yeast develops Message sent to patient's MyChart

## 2021-02-24 ENCOUNTER — Other Ambulatory Visit: Payer: Self-pay | Admitting: Advanced Practice Midwife

## 2021-02-24 DIAGNOSIS — B009 Herpesviral infection, unspecified: Secondary | ICD-10-CM

## 2021-02-24 MED ORDER — VALACYCLOVIR HCL 500 MG PO TABS: 500.0000 mg | ORAL_TABLET | Freq: Every day | ORAL | 11 refills | Status: DC

## 2021-02-24 NOTE — Progress Notes (Signed)
Rx valtrex sent per patient request

## 2021-03-03 ENCOUNTER — Ambulatory Visit: Payer: BLUE CROSS/BLUE SHIELD | Attending: Urology

## 2021-03-20 ENCOUNTER — Other Ambulatory Visit: Payer: Self-pay

## 2021-03-20 ENCOUNTER — Emergency Department
Admission: EM | Admit: 2021-03-20 | Discharge: 2021-03-20 | Disposition: A | Discharge status: Home / Self Care | Payer: BLUE CROSS/BLUE SHIELD | Attending: Emergency Medicine | Admitting: Emergency Medicine

## 2021-03-20 ENCOUNTER — Emergency Department: Payer: BLUE CROSS/BLUE SHIELD

## 2021-03-20 DIAGNOSIS — Y92410 Unspecified street and highway as the place of occurrence of the external cause: Secondary | ICD-10-CM | POA: Diagnosis not present

## 2021-03-20 DIAGNOSIS — S299XXA Unspecified injury of thorax, initial encounter: Secondary | ICD-10-CM | POA: Diagnosis present

## 2021-03-20 DIAGNOSIS — S20211A Contusion of right front wall of thorax, initial encounter: Secondary | ICD-10-CM

## 2021-03-20 DIAGNOSIS — M25511 Pain in right shoulder: Secondary | ICD-10-CM | POA: Insufficient documentation

## 2021-03-20 DIAGNOSIS — F17210 Nicotine dependence, cigarettes, uncomplicated: Secondary | ICD-10-CM | POA: Diagnosis not present
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
## 2021-04-01 ENCOUNTER — Telehealth: Payer: Self-pay | Admitting: Family Medicine

## 2021-04-01 DIAGNOSIS — R109 Unspecified abdominal pain: Secondary | ICD-10-CM

## 2021-04-01 NOTE — Telephone Encounter (Signed)
Patient states there was an appointment made for the Ct sone protocol and she was never called or informed of the date and time. She is wanting this to be rescheduled. I put a new order in for the Ct scan.

## 2021-04-17 ENCOUNTER — Ambulatory Visit (INDEPENDENT_AMBULATORY_CARE_PROVIDER_SITE_OTHER): Payer: BLUE CROSS/BLUE SHIELD | Admitting: Obstetrics and Gynecology

## 2021-04-17 ENCOUNTER — Encounter: Payer: Self-pay | Admitting: Obstetrics and Gynecology

## 2021-04-17 ENCOUNTER — Ambulatory Visit
Admission: RE | Admit: 2021-04-17 | Discharge: 2021-04-17 | Disposition: A | Discharge status: Home / Self Care | Payer: BLUE CROSS/BLUE SHIELD | Source: Ambulatory Visit | Attending: Urology | Admitting: Urology

## 2021-04-17 ENCOUNTER — Other Ambulatory Visit: Payer: Self-pay

## 2021-04-17 ENCOUNTER — Other Ambulatory Visit (HOSPITAL_COMMUNITY)
Admission: RE | Admit: 2021-04-17 | Discharge: 2021-04-17 | Disposition: A | Discharge status: Home / Self Care | Payer: BLUE CROSS/BLUE SHIELD | Source: Ambulatory Visit | Attending: Obstetrics and Gynecology | Admitting: Obstetrics and Gynecology

## 2021-04-17 VITALS — BP 120/66 | Ht 64.0 in | Wt 225.0 lb

## 2021-04-17 DIAGNOSIS — R399 Unspecified symptoms and signs involving the genitourinary system: Secondary | ICD-10-CM | POA: Diagnosis not present

## 2021-04-17 DIAGNOSIS — N898 Other specified noninflammatory disorders of vagina: Secondary | ICD-10-CM

## 2021-04-17 DIAGNOSIS — Z113 Encounter for screening for infections with a predominantly sexual mode of transmission: Secondary | ICD-10-CM

## 2021-04-17 DIAGNOSIS — R3129 Other microscopic hematuria: Secondary | ICD-10-CM | POA: Diagnosis not present

## 2021-04-17 DIAGNOSIS — R109 Unspecified abdominal pain: Secondary | ICD-10-CM | POA: Diagnosis not present

## 2021-04-17 DIAGNOSIS — R102 Pelvic and perineal pain: Secondary | ICD-10-CM | POA: Diagnosis not present

## 2021-04-17 LAB — POCT URINALYSIS DIPSTICK
Bilirubin, UA: NEGATIVE
Glucose, UA: NEGATIVE
Ketones, UA: NEGATIVE
Leukocytes, UA: NEGATIVE
Nitrite, UA: NEGATIVE
Protein, UA: NEGATIVE
Spec Grav, UA: 1.025 (ref 1.010–1.025)
pH, UA: 5 (ref 5.0–8.0)

## 2021-04-17 LAB — POCT WET PREP WITH KOH
KOH Prep POC: NEGATIVE
Trichomonas, UA: NEGATIVE
Yeast Wet Prep HPF POC: NEGATIVE

## 2021-04-17 MED ORDER — FLUCONAZOLE 150 MG PO TABS: 150.0000 mg | ORAL_TABLET | Freq: Once | ORAL | 0 refills | Status: AC

## 2021-04-17 MED ORDER — METRONIDAZOLE 500 MG PO TABS: 500.0000 mg | ORAL_TABLET | Freq: Two times a day (BID) | ORAL | 0 refills | Status: AC

## 2021-04-17 NOTE — Progress Notes (Signed)
Marilyn Code, MD   Chief Complaint  Patient presents with  . Urinary Tract Infection    Frequency and pain urinating, pelvic and back pain x 5 days  . Vaginal Discharge    Itchiness, abnormal odor x 5 days    HPI:      [name] is a 35 y.o. [id number] whose LMP was Patient's last menstrual period was 03/31/2021 (exact date)., presents today for increased vag d/c with irritation and fishy odor for past 5 days. No meds to treat. Hx of BV, confirmed on several cultures, last treated with metrogel 3/22. Uses dove sens skin soap and dryer sheets, no probiotic use. Pt also with urinary frequency with and without good flow, and dysuria. Having pelvic pain, LBP, no fevers or gross hematuria. No vaginal bleeding today. Has hx of microhematuria and flank pain with neg renal u/s 2/22 with urology. Scheduled for renal u/s today. If neg, will then have cystoscopy.  Last sex partner unfaithful, so pt would like STD testing. Neg STD testing 3/22. Not sex active now, on OCPs.   Past Medical History:  Diagnosis Date  . Anxiety and depression   . Ectopic pregnancy   . Obesity   . Recurrent vaginitis     Past Surgical History:  Procedure Laterality Date  . ADENOIDECTOMY  1986  . DILATION AND CURETTAGE OF UTERUS  2009-04-05  . ECTOPIC PREGNANCY SURGERY  05-Apr-2005   Dr Toya Smothers  . TONSILLECTOMY  2006/04/05    Family History  Problem Relation Age of Onset  . Diabetes Mellitus II Mother   . COPD Mother   . Arthritis Mother   . Stroke Father   . Hypertension Father   . Brain cancer Father        meningioma-died 05-Apr-2013 . Diabetes Father     Social History   Socioeconomic History  . Marital status: Single    Spouse name: Not on file  . Number of children: Not on file  . Years of education: Not on file  . Highest education level: Not on file  Occupational History  . Not on file  Tobacco Use  . Smoking status: Current Every Day Smoker    Types: Cigarettes  . Smokeless tobacco:  Never Used  Vaping Use  . Vaping Use: Never used  Substance and Sexual Activity  . Alcohol use: Yes    Comment: occasionally  . Drug use: No  . Sexual activity: Not Currently    Birth control/protection: Pill  Other Topics Concern  . Not on file  Social History Narrative  . Not on file   Social Determinants of Health   Financial Resource Strain: Not on file  Food Insecurity: Not on file  Transportation Needs: Not on file  Physical Activity: Not on file  Stress: Not on file  Social Connections: Not on file  Intimate Partner Violence: Not on file    Outpatient Medications Prior to Visit  Medication Sig Dispense Refill  . busPIRone (BUSPAR) 10 MG tablet TAKE 1/2 TO 1 TABLET BY MOUTH TWICE DAILY AS NEEDED FOR ANXIETY 180 tablet 1  . JUNEL FE 1/20 1-20 MG-MCG tablet Take 1 tab po as directed 84 tablet 2  . valACYclovir (VALTREX) 500 MG tablet Take 1 tablet (500 mg total) by mouth daily. 30 tablet 11   No facility-administered medications prior to visit.      ROS:  Review of Systems  Constitutional: Negative for fever.  Gastrointestinal: Negative for blood  in stool, constipation, diarrhea, nausea and vomiting.  Genitourinary: Positive for dysuria, frequency, pelvic pain and vaginal discharge. Negative for dyspareunia, flank pain, hematuria, urgency, vaginal bleeding and vaginal pain.  Musculoskeletal: Positive for back pain.  Skin: Negative for rash.    OBJECTIVE:   Vitals:  BP 120/66   Ht 5\' 4"  (1.626 m)   Wt 225 lb (102.1 kg)   LMP 03/31/2021 (Exact Date)   BMI 38.62 kg/m   Physical Exam Vitals reviewed.  Constitutional:      Appearance: She is well-developed. She is not ill-appearing or toxic-appearing.  Pulmonary:     Effort: Pulmonary effort is normal.  Abdominal:     Palpations: Abdomen is soft.     Tenderness: There is abdominal tenderness in the right lower quadrant, suprapubic area and left lower quadrant. There is no right CVA tenderness or left CVA  tenderness.  Genitourinary:    General: Normal vulva.     Pubic Area: No rash.      Labia:        Right: No rash, tenderness or lesion.        Left: No rash, tenderness or lesion.      Vagina: Normal. No vaginal discharge, erythema, tenderness or bleeding.     Cervix: Normal.     Uterus: Normal. Tender. Not enlarged.      Adnexa:        Right: Tenderness present. No mass.         Left: Tenderness present. No mass.    Musculoskeletal:        General: Normal range of motion.     Cervical back: Normal range of motion.  Skin:    General: Skin is warm and dry.  Neurological:     General: No focal deficit present.     Mental Status: She is alert and oriented to person, place, and time.     Cranial Nerves: No cranial nerve deficit.  Psychiatric:        Mood and Affect: Mood normal.        Behavior: Behavior normal.        Thought Content: Thought content normal.        Judgment: Judgment normal.     Results: Results for orders placed or performed in visit on 04/17/21 (from the past 24 hour(s))  POCT Urinalysis Dipstick     Status: Abnormal   Collection Time: 04/17/21 11:01 AM  Result Value Ref Range   Color, UA yellow    Clarity, UA clear    Glucose, UA Negative Negative   Bilirubin, UA neg    Ketones, UA neg    Spec Grav, UA 1.025 1.010 - 1.025   Blood, UA mod    pH, UA 5.0 5.0 - 8.0   Protein, UA Negative Negative   Urobilinogen, UA     Nitrite, UA neg    Leukocytes, UA Negative Negative   Appearance     Odor    POCT Wet Prep with KOH     Status: Abnormal   Collection Time: 04/17/21 11:01 AM  Result Value Ref Range   Trichomonas, UA Negative    Clue Cells Wet Prep HPF POC few    Epithelial Wet Prep HPF POC     Yeast Wet Prep HPF POC neg    Bacteria Wet Prep HPF POC     RBC Wet Prep HPF POC     WBC Wet Prep HPF POC     KOH Prep POC Negative Negative  NO VAG BLEEDING ON EXAM   Assessment/Plan: UTI symptoms - Plan: POCT Urinalysis Dipstick, Urine Culture; pos  sx and blood on UA with hx of microhematuria. Check C&S; will f/u if pos. Has renal CT today.   Microhematuria - Plan: POCT Urinalysis Dipstick  Vaginal discharge - Plan: POCT Wet Prep with KOH, Cervicovaginal ancillary only, metroNIDAZOLE (FLAGYL) 500 MG tablet, fluconazole (DIFLUCAN) 150 MG tablet; hx of BV, pos sx. Rx flagyl, no EtOH. Dove sens skin soap, no dryer sheets, add probiotics, condoms. F/u prn.   Pelvic pain--tender on exam. See if sx resolve with BV tx. If not, will check GYN u/s.   Screening for STD (sexually transmitted disease) - Plan: Cervicovaginal ancillary only   Meds ordered this encounter  Medications  . metroNIDAZOLE (FLAGYL) 500 MG tablet    Sig: Take 1 tablet (500 mg total) by mouth 2 (two) times daily for 7 days.    Dispense:  14 tablet    Refill:  0    Order Specific Question:   Supervising Provider    Answer:   Nadara Mustard [id number]  . fluconazole (DIFLUCAN) 150 MG tablet    Sig: Take 1 tablet (150 mg total) by mouth once for 1 dose.    Dispense:  1 tablet    Refill:  0    Order Specific Question:   Supervising Provider    Answer:   Nadara Mustard [[id number]]      Return if symptoms worsen or fail to improve.  Qadir Folks B. Rahul Malinak, PA-C 04/17/2021 11:05 AM

## 2021-04-17 NOTE — Patient Instructions (Addendum)
I value your feedback and you entrusting us with your care. If you get a [place] patient survey, I would appreciate you taking the time to let us know about your experience today. Thank you!  HEALTHY VAGINAL HYGIENE  AVOID   Panytyhose Synthetic underwear (wear COTTON underwear)  Tight pants/jeans Thongs Pantyliners Scented soaps/shower gels (use Dove Sensitive Skin soap or water to clean) Bubble bath/bath bombs Scented detergents  ALL dryer sheets (line dry underwear if using them on your other clothing) Feminine sprays/douches   FOR RECURRENT BACTERIAL VAGINOSIS (BV) Above recommendations and ADD probiotics daily, USE CONDOMS  Visit [email or web address]    

## 2021-04-18 ENCOUNTER — Other Ambulatory Visit: Payer: Self-pay | Admitting: *Deleted

## 2021-04-18 ENCOUNTER — Telehealth: Payer: Self-pay | Admitting: *Deleted

## 2021-04-18 DIAGNOSIS — R109 Unspecified abdominal pain: Secondary | ICD-10-CM

## 2021-04-18 DIAGNOSIS — R3129 Other microscopic hematuria: Secondary | ICD-10-CM

## 2021-04-18 LAB — CERVICOVAGINAL ANCILLARY ONLY
Chlamydia: NEGATIVE
Comment: NEGATIVE
Comment: NORMAL
Neisseria Gonorrhea: NEGATIVE

## 2021-04-18 NOTE — Telephone Encounter (Signed)
-----   Message from Riki Altes, MD sent at 04/18/2021  2:36 PM EDT ----- CT did not show ureteral calculi or kidney blockage.  No findings identified that would cause her pain or blood in the urine.  There is a tiny right renal calculus which would not be causing these signs/symptoms.  Would recommend scheduling cystoscopy to evaluate for a bladder source

## 2021-04-18 NOTE — Telephone Encounter (Signed)
Patient informed, scheduled cysto-patient verbalized understanding.

## 2021-04-19 LAB — URINE CULTURE

## 2021-04-22 ENCOUNTER — Telehealth: Payer: Self-pay

## 2021-04-22 ENCOUNTER — Encounter: Payer: Self-pay | Admitting: Obstetrics and Gynecology

## 2021-04-22 NOTE — Telephone Encounter (Signed)
[employer] work patient dropped off for new employment signed by Provider and given to patients mother okay per patient. Copy placed in scan. Toni Amend

## 2021-05-09 ENCOUNTER — Other Ambulatory Visit: Payer: Self-pay | Admitting: Urology

## 2021-05-09 ENCOUNTER — Encounter: Payer: Self-pay | Admitting: Urology

## 2021-07-15 ENCOUNTER — Other Ambulatory Visit: Payer: Self-pay | Admitting: Internal Medicine

## 2021-07-15 DIAGNOSIS — F411 Generalized anxiety disorder: Secondary | ICD-10-CM

## 2021-07-15 DIAGNOSIS — F3341 Major depressive disorder, recurrent, in partial remission: Secondary | ICD-10-CM

## 2021-07-15 DIAGNOSIS — F17210 Nicotine dependence, cigarettes, uncomplicated: Secondary | ICD-10-CM

## 2021-07-15 DIAGNOSIS — R300 Dysuria: Secondary | ICD-10-CM

## 2021-07-15 DIAGNOSIS — Z0001 Encounter for general adult medical examination with abnormal findings: Secondary | ICD-10-CM

## 2021-07-15 DIAGNOSIS — Z20822 Contact with and (suspected) exposure to covid-19: Secondary | ICD-10-CM

## 2021-07-15 DIAGNOSIS — E669 Obesity, unspecified: Secondary | ICD-10-CM

## 2021-08-13 ENCOUNTER — Telehealth: Payer: BLUE CROSS/BLUE SHIELD | Admitting: Nurse Practitioner

## 2021-09-14 NOTE — Progress Notes (Signed)
[name], MD   Chief Complaint  Patient presents with   Vaginal Discharge    Sour odor, itchiness and irritation x 1 week    HPI:      Marilyn Li is a 35 y.o. [phone] whose LMP was Patient's last menstrual period was 08/31/2021 (approximate)., presents today for increased vag d/c with odor and irritation for the past wk. Was treated with amox for pharyngitis a couple wks prior. Hx of BV, treated 3/22 and 5/22. Not taking probiotics. No urin sx, no fevers. Has had some LBP since pharyngitis and pelvic pain recently.  She was sex active with partner who was unfaithful. Pt would like STD testing.   Past Medical History:  Diagnosis Date   Anxiety and depression    Ectopic pregnancy    Obesity    Recurrent vaginitis     Past Surgical History:  Procedure Laterality Date   ADENOIDECTOMY  4   DILATION AND CURETTAGE OF UTERUS  2010   ECTOPIC PREGNANCY SURGERY  03-24-2005   Dr Toya Smothers   TONSILLECTOMY  2006/03/24    Family History  Problem Relation Age of Onset   Diabetes Mellitus II Mother    COPD Mother    Arthritis Mother    Stroke Father    Hypertension Father    Brain cancer Father        meningioma-died 2013-03-24  Diabetes Father     Social History   Socioeconomic History   Marital status: Single    Spouse name: Not on file   Number of children: Not on file   Years of education: Not on file   Highest education level: Not on file  Occupational History   Not on file  Tobacco Use   Smoking status: Every Day    Types: Cigarettes   Smokeless tobacco: Never  Vaping Use   Vaping Use: Never used  Substance and Sexual Activity   Alcohol use: Yes    Comment: occasionally   Drug use: No   Sexual activity: Yes    Birth control/protection: Pill  Other Topics Concern   Not on file  Social History Narrative   Not on file   Social Determinants of Health   Financial Resource Strain: Not on file  Food Insecurity: Not on file  Transportation  Needs: Not on file  Physical Activity: Not on file  Stress: Not on file  Social Connections: Not on file  Intimate Partner Violence: Not on file    Outpatient Medications Prior to Visit  Medication Sig Dispense Refill   busPIRone (BUSPAR) 10 MG tablet TAKE 1/2 TO 1 TABLET BY MOUTH TWICE DAILY AS NEEDED FOR ANXIETY 180 tablet 1   JUNEL FE 1/20 1-20 MG-MCG tablet Take 1 tab po as directed 84 tablet 2   valACYclovir (VALTREX) 500 MG tablet Take 1 tablet (500 mg total) by mouth daily. 30 tablet 11   No facility-administered medications prior to visit.      ROS:  Review of Systems  Constitutional:  Negative for fever.  Gastrointestinal:  Negative for blood in stool, constipation, diarrhea, nausea and vomiting.  Genitourinary:  Positive for pelvic pain and vaginal discharge. Negative for dyspareunia, dysuria, flank pain, frequency, hematuria, urgency, vaginal bleeding and vaginal pain.  Musculoskeletal:  Positive for back pain.  Skin:  Negative for rash.  BREAST: No symptoms   OBJECTIVE:   Vitals:  BP 130/90   Ht 5\' 4"  (1.626 m)   Wt 223 lb (  101.2 kg)   LMP 08/31/2021 (Approximate)   BMI 38.28 kg/m   Physical Exam Vitals reviewed.  Constitutional:      Appearance: She is well-developed.  Pulmonary:     Effort: Pulmonary effort is normal.  Genitourinary:    General: Normal vulva.     Pubic Area: No rash.      Labia:        Right: No rash, tenderness or lesion.        Left: No rash, tenderness or lesion.      Vagina: Vaginal discharge present. No erythema or tenderness.     Cervix: Normal.     Uterus: Normal. Not enlarged and not tender.      Adnexa: Right adnexa normal and left adnexa normal.       Right: No mass or tenderness.         Left: No mass or tenderness.    Musculoskeletal:        General: Normal range of motion.     Cervical back: Normal range of motion.  Skin:    General: Skin is warm and dry.  Neurological:     General: No focal deficit present.      Mental Status: She is alert and oriented to person, place, and time.  Psychiatric:        Mood and Affect: Mood normal.        Behavior: Behavior normal.        Thought Content: Thought content normal.        Judgment: Judgment normal.    Results: Results for orders placed or performed in visit on 09/15/21 (from the past 24 hour(s))  POCT Wet Prep with KOH     Status: Abnormal   Collection Time: 09/15/21 10:11 AM  Result Value Ref Range   Trichomonas, UA Negative    Clue Cells Wet Prep HPF POC pos    Epithelial Wet Prep HPF POC     Yeast Wet Prep HPF POC neg    Bacteria Wet Prep HPF POC     RBC Wet Prep HPF POC     WBC Wet Prep HPF POC     KOH Prep POC Negative Negative     Assessment/Plan: BV (bacterial vaginosis) - Plan: metroNIDAZOLE (FLAGYL) 500 MG tablet, POCT Wet Prep with KOH; pos sx and wet prep. Rx flagyl, no EtoH. Add probiotics, will RF if sx recur. F/u prn.   Vaginal itching - Plan: fluconazole (DIFLUCAN) 150 MG tablet, POCT Wet Prep with KOH; neg wet prep, treat empirically for yeast given recent amox tx. F/u prn.   Screening for STD (sexually transmitted disease) - Plan: Cervicovaginal ancillary only  Exposure to STD - Plan: Cervicovaginal ancillary only    Meds ordered this encounter  Medications   metroNIDAZOLE (FLAGYL) 500 MG tablet    Sig: Take 1 tablet (500 mg total) by mouth 2 (two) times daily for 7 days.    Dispense:  14 tablet    Refill:  0    Order Specific Question:   Supervising Provider    Answer:   Nadara Mustard [[id number]]   fluconazole (DIFLUCAN) 150 MG tablet    Sig: Take 1 tablet (150 mg total) by mouth once for 1 dose. May repeat in 3 days if still having symptoms    Dispense:  2 tablet    Refill:  0    Order Specific Question:   Supervising Provider    Answer:   Nadara Mustard [[id number]]  Return if symptoms worsen or fail to improve.  Huntleigh Doolen B. Ronell Duffus, PA-C 09/15/2021 10:13 AM

## 2021-09-15 ENCOUNTER — Other Ambulatory Visit: Payer: Self-pay

## 2021-09-15 ENCOUNTER — Ambulatory Visit (INDEPENDENT_AMBULATORY_CARE_PROVIDER_SITE_OTHER): Payer: BLUE CROSS/BLUE SHIELD | Admitting: Obstetrics and Gynecology

## 2021-09-15 ENCOUNTER — Encounter: Payer: Self-pay | Admitting: Obstetrics and Gynecology

## 2021-09-15 ENCOUNTER — Other Ambulatory Visit (HOSPITAL_COMMUNITY)
Admission: RE | Admit: 2021-09-15 | Discharge: 2021-09-15 | Disposition: A | Discharge status: Home / Self Care | Payer: BLUE CROSS/BLUE SHIELD | Source: Ambulatory Visit | Attending: Obstetrics and Gynecology | Admitting: Obstetrics and Gynecology

## 2021-09-15 VITALS — BP 130/90 | Ht 64.0 in | Wt 223.0 lb

## 2021-09-15 DIAGNOSIS — B9689 Other specified bacterial agents as the cause of diseases classified elsewhere: Secondary | ICD-10-CM

## 2021-09-15 DIAGNOSIS — N760 Acute vaginitis: Secondary | ICD-10-CM | POA: Diagnosis not present

## 2021-09-15 DIAGNOSIS — Z202 Contact with and (suspected) exposure to infections with a predominantly sexual mode of transmission: Secondary | ICD-10-CM | POA: Diagnosis not present

## 2021-09-15 DIAGNOSIS — N898 Other specified noninflammatory disorders of vagina: Secondary | ICD-10-CM | POA: Diagnosis not present

## 2021-09-15 DIAGNOSIS — Z113 Encounter for screening for infections with a predominantly sexual mode of transmission: Secondary | ICD-10-CM

## 2021-09-15 LAB — POCT WET PREP WITH KOH
Clue Cells Wet Prep HPF POC: POSITIVE
KOH Prep POC: NEGATIVE
Trichomonas, UA: NEGATIVE
Yeast Wet Prep HPF POC: NEGATIVE

## 2021-09-15 MED ORDER — FLUCONAZOLE 150 MG PO TABS: 150.0000 mg | ORAL_TABLET | Freq: Once | ORAL | 0 refills | Status: AC

## 2021-09-15 MED ORDER — METRONIDAZOLE 500 MG PO TABS: 500.0000 mg | ORAL_TABLET | Freq: Two times a day (BID) | ORAL | 0 refills | Status: DC

## 2021-09-15 NOTE — Patient Instructions (Signed)
I value your feedback and you entrusting us with your care. If you get a [facility] patient survey, I would appreciate you taking the time to let us know about your experience today. Thank you!  HEALTHY VAGINAL HYGIENE  AVOID   Panytyhose Synthetic underwear (wear COTTON underwear)  Tight pants/jeans Thongs Pantyliners Scented soaps/shower gels (use Dove Sensitive Skin soap or water to clean) Bubble bath/bath bombs Scented detergents  ALL dryer sheets (line dry underwear if using them on your other clothing) Feminine sprays/douches   FOR RECURRENT BACTERIAL VAGINOSIS (BV) Above recommendations and ADD probiotics daily, USE CONDOMS  Visit [email or web address]    

## 2021-09-16 LAB — CERVICOVAGINAL ANCILLARY ONLY
Chlamydia: NEGATIVE
Comment: NEGATIVE
Comment: NEGATIVE
Comment: NORMAL
Neisseria Gonorrhea: NEGATIVE
Trichomonas: NEGATIVE

## 2021-09-25 ENCOUNTER — Ambulatory Visit: Payer: BLUE CROSS/BLUE SHIELD | Admitting: Obstetrics and Gynecology

## 2021-10-28 ENCOUNTER — Ambulatory Visit: Payer: BLUE CROSS/BLUE SHIELD | Admitting: Nurse Practitioner

## 2021-11-03 ENCOUNTER — Ambulatory Visit (INDEPENDENT_AMBULATORY_CARE_PROVIDER_SITE_OTHER): Payer: BLUE CROSS/BLUE SHIELD | Admitting: Obstetrics and Gynecology

## 2021-11-03 ENCOUNTER — Encounter: Payer: Self-pay | Admitting: Obstetrics and Gynecology

## 2021-11-03 ENCOUNTER — Other Ambulatory Visit: Payer: Self-pay

## 2021-11-03 VITALS — BP 110/80 | Ht 64.0 in | Wt 227.0 lb

## 2021-11-03 DIAGNOSIS — R102 Pelvic and perineal pain: Secondary | ICD-10-CM

## 2021-11-03 DIAGNOSIS — N898 Other specified noninflammatory disorders of vagina: Secondary | ICD-10-CM | POA: Diagnosis not present

## 2021-11-03 DIAGNOSIS — Z01419 Encounter for gynecological examination (general) (routine) without abnormal findings: Secondary | ICD-10-CM | POA: Diagnosis not present

## 2021-11-03 DIAGNOSIS — Z3041 Encounter for surveillance of contraceptive pills: Secondary | ICD-10-CM

## 2021-11-03 LAB — POCT WET PREP WITH KOH
Clue Cells Wet Prep HPF POC: NEGATIVE
KOH Prep POC: NEGATIVE
Trichomonas, UA: NEGATIVE
Yeast Wet Prep HPF POC: NEGATIVE

## 2021-11-03 MED ORDER — NORETHINDRONE 0.35 MG PO TABS: 1.0000 | ORAL_TABLET | Freq: Every day | ORAL | 3 refills | Status: DC

## 2021-11-03 NOTE — Progress Notes (Signed)
PCP:  Lavera Guise, MD   Chief Complaint  Patient presents with   Gynecologic Exam    No concerns     HPI:      [name] is a 35 y.o. [phone] whose LMP was Patient's last menstrual period was 10/29/2021 (exact date)., presents today for her annual examination.  Her menses are regular every 28-30 days, lasting 4-5 days on OCPs.  Dysmenorrhea mild, occurring first 1-2 days of flow. She does not have intermenstrual bleeding. Hx of menorrhagia in past, improved with OCPs. Tried Mirena but had pelvic pain and dyspareunia, so removed without change of sx.   Sex activity: single partner, contraception - OCP (estrogen/progesterone). Hx of CPP/dyspareunia for many yrs--sx only with sex and in 1 area mid abdominal area. Had had neg GYN u/s. Dx lap vs amb ref to vascular for pelvic congestion syndrome discussed at 2/21 appt with Dr. Gilman Schmidt. Pt to f/u prn.  NOW having some pelvic discomfort for 2-3 wks, more of an ache. Worse in AM. Also with increased vag d/c with mild irritation, no fishy odor. Hx of BV in past, last treated 10/22. Not recently sex active. Also having RT shoulder and RT knee discomfort. Has a job with bending and moving. No GI, urin sx. Currently having her period.  Last Pap: 06/28/19  Results were: no abnormalities /neg HPV DNA  Hx of BV, confirmed on nuswab. Last treated 10/22  There is no FH of breast cancer. There is no FH of ovarian cancer. The patient does do self-breast exams.  Tobacco use: smokes 1/2 ppd, quit in past; not fully ready to quit Alcohol use: none No drug use.  Exercise: moderately active  She does get adequate calcium but not Vitamin D in her diet.   Patient Active Problem List   Diagnosis Date Noted   Microhematuria 04/17/2021   Anemia 02/17/2021   Obesity 02/17/2021   Tobacco use disorder 08/07/2019   Low vitamin D level 07/07/2019   Current moderate episode of major depressive disorder without prior episode ([place]) 07/27/2018    Generalized anxiety disorder 07/27/2018   Bacterial vaginitis 05/27/2018   Candidiasis 05/27/2018     Past Medical History:  Diagnosis Date   Anxiety and depression    Ectopic pregnancy    Obesity    Recurrent vaginitis     Past Surgical History:  Procedure Laterality Date   ADENOIDECTOMY  52   DILATION AND CURETTAGE OF UTERUS  03-27-2009   ECTOPIC PREGNANCY SURGERY  27-Mar-2005   Dr Georga Bora   TONSILLECTOMY  2006/03/27    Family History  Problem Relation Age of Onset   Diabetes Mellitus II Mother    COPD Mother    Arthritis Mother    Stroke Father    Hypertension Father    Brain cancer Father        meningioma-died 2013-03-27   Diabetes Father     Social History   Socioeconomic History   Marital status: Single    Spouse name: Not on file   Number of children: Not on file   Years of education: Not on file   Highest education level: Not on file  Occupational History   Not on file  Tobacco Use   Smoking status: Every Day    Types: Cigarettes   Smokeless tobacco: Never  Vaping Use   Vaping Use: Never used  Substance and Sexual Activity   Alcohol use: Yes    Comment: occasionally   Drug use:  No   Sexual activity: Not Currently    Birth control/protection: Pill  Other Topics Concern   Not on file  Social History Narrative   Not on file   Social Determinants of Health   Financial Resource Strain: Not on file  Food Insecurity: Not on file  Transportation Needs: Not on file  Physical Activity: Not on file  Stress: Not on file  Social Connections: Not on file  Intimate Partner Violence: Not on file     Current Outpatient Medications:    busPIRone (BUSPAR) 10 MG tablet, TAKE 1/2 TO 1 TABLET BY MOUTH TWICE DAILY AS NEEDED FOR ANXIETY, Disp: 180 tablet, Rfl: 1   norethindrone (MICRONOR) 0.35 MG tablet, Take 1 tablet (0.35 mg total) by mouth daily., Disp: 84 tablet, Rfl: 3   valACYclovir (VALTREX) 500 MG tablet, Take 1 tablet (500 mg total) by mouth daily., Disp:  30 tablet, Rfl: 11   ROS:  Review of Systems  Constitutional:  Negative for fatigue, fever and unexpected weight change.  Respiratory:  Negative for cough, shortness of breath and wheezing.   Cardiovascular:  Negative for chest pain, palpitations and leg swelling.  Gastrointestinal:  Negative for blood in stool, constipation, diarrhea, nausea and vomiting.  Endocrine: Negative for cold intolerance, heat intolerance and polyuria.  Genitourinary:  Positive for dyspareunia and vaginal discharge. Negative for dysuria, flank pain, frequency, genital sores, hematuria, menstrual problem, pelvic pain, urgency, vaginal bleeding and vaginal pain.  Musculoskeletal:  Negative for back pain, joint swelling and myalgias.  Skin:  Negative for rash.  Neurological:  Negative for dizziness, syncope, light-headedness, numbness and headaches.  Hematological:  Negative for adenopathy.  Psychiatric/Behavioral:  Negative for agitation, confusion, sleep disturbance and suicidal ideas. The patient is not nervous/anxious.   BREAST: No symptoms   Objective: BP 110/80   Ht 5\' 4"  (1.626 m)   Wt 227 lb (103 kg)   LMP 10/29/2021 (Exact Date)   BMI 38.96 kg/m    Physical Exam Constitutional:      Appearance: She is well-developed.  Genitourinary:     Vulva normal.     Genitourinary Comments: TENDER TO PALPATE RT UMBILICAL AREA     Right Labia: No rash, tenderness or lesions.    Left Labia: No tenderness, lesions or rash.    Vaginal bleeding present.     No vaginal discharge, erythema or tenderness.      Right Adnexa: not tender and no mass present.    Left Adnexa: not tender and no mass present.    No cervical friability or polyp.     Uterus is tender.     Uterus is not enlarged.  Breasts:    Right: No mass, nipple discharge, skin change or tenderness.     Left: No mass, nipple discharge, skin change or tenderness.  Neck:     Thyroid: No thyromegaly.  Cardiovascular:     Rate and Rhythm: Normal  rate and regular rhythm.     Heart sounds: Normal heart sounds. No murmur heard. Pulmonary:     Effort: Pulmonary effort is normal.     Breath sounds: Normal breath sounds.  Abdominal:     Palpations: Abdomen is soft.     Tenderness: There is abdominal tenderness in the suprapubic area. There is no guarding or rebound.  Musculoskeletal:        General: Normal range of motion.     Cervical back: Normal range of motion.  Lymphadenopathy:     Cervical: No cervical adenopathy.  Neurological:     General: No focal deficit present.     Mental Status: She is alert and oriented to person, place, and time.     Cranial Nerves: No cranial nerve deficit.  Skin:    General: Skin is warm and dry.  Psychiatric:        Mood and Affect: Mood normal.        Behavior: Behavior normal.        Thought Content: Thought content normal.        Judgment: Judgment normal.  Vitals reviewed.    Assessment/Plan: Encounter for annual routine gynecological examination  Encounter for surveillance of contraceptive pills - Plan: norethindrone (MICRONOR) 0.35 MG tablet; OCP change to POPs due to tob use. Discussed other prog only options. Pt with hx of menorrhagia so will have to see how POPs work. Aware to take at same time daily vs need for condoms/abstinence. Can return to estrogen OCPs if stops smoking.   Vaginal discharge - Plan: POCT Wet Prep with KOH; neg wet prep, on period. No evid of BV. F/u if sx progress.   Pelvic pain--slightly tender on exam. Question MSK. F/u if sx persist for GYN u/s.    Meds ordered this encounter  Medications   norethindrone (MICRONOR) 0.35 MG tablet    Sig: Take 1 tablet (0.35 mg total) by mouth daily.    Dispense:  84 tablet    Refill:  3    Order Specific Question:   Supervising Provider    Answer:   Gae Dry [id number]              GYN counsel adequate intake of calcium and vitamin D, diet and exercise, tobacco cessation     F/U  Return in about 1 year  (around 11/03/2022).  Prisilla Kocsis B. Omkar Stratmann, PA-C 11/03/2021 3:02 PM

## 2021-11-13 ENCOUNTER — Other Ambulatory Visit: Payer: Self-pay

## 2021-11-13 ENCOUNTER — Ambulatory Visit (INDEPENDENT_AMBULATORY_CARE_PROVIDER_SITE_OTHER): Payer: BLUE CROSS/BLUE SHIELD | Admitting: Nurse Practitioner

## 2021-11-13 ENCOUNTER — Encounter: Payer: Self-pay | Admitting: Nurse Practitioner

## 2021-11-13 VITALS — BP 105/70 | HR 77 | Temp 98.1°F | Ht 64.0 in | Wt 223.4 lb

## 2021-11-13 DIAGNOSIS — G5603 Carpal tunnel syndrome, bilateral upper limbs: Secondary | ICD-10-CM

## 2021-11-13 DIAGNOSIS — Z6838 Body mass index (BMI) 38.0-38.9, adult: Secondary | ICD-10-CM | POA: Diagnosis not present

## 2021-11-13 DIAGNOSIS — Z7689 Persons encountering health services in other specified circumstances: Secondary | ICD-10-CM | POA: Diagnosis not present

## 2021-11-13 MED ORDER — CARPAL TUNNEL WRIST STABILIZER MISC: 1 refills | Status: DC

## 2021-11-13 MED ORDER — MELOXICAM 7.5 MG PO TABS: 7.5000 mg | ORAL_TABLET | Freq: Every day | ORAL | 2 refills | Status: DC

## 2021-11-13 NOTE — Progress Notes (Signed)
New Patient Office Visit  Subjective:  Patient ID: Marilyn Li, female    DOB: December 12, 1985  Age: 35 y.o. MRN: [id number]  CC:  Chief Complaint  Patient presents with   New Patient (Initial Visit)    HPI ALYZE LAUF presents to establish new primary care provider. Today, she is complaining of pain in both wrists. Tenderness radiates into the 4th and 5th fingers of the right hand. She is also experiencing numbness along the outer aspect of the 5th finger. Sometimes, making fists or holding things with the right hand is difficult. This is more severe on the right side than the left. The patient is right handed. She states that she does not take anything for this on routine basis. Has not tried use of NSAIDs at this point.  The patient states that she stays cold much of the time. She states that she mentioned this to her GYN provider at recent visit. Was dvised t speak to PCP about this as she may be anemic.  The patient denies other concerns or complaints today.    Past Medical History:  Diagnosis Date   Anxiety and depression    Ectopic pregnancy    Obesity    Recurrent vaginitis     Past Surgical History:  Procedure Laterality Date   ADENOIDECTOMY  73   DILATION AND CURETTAGE OF UTERUS  2010   ECTOPIC PREGNANCY SURGERY  Mar 19, 2005   Dr Toya Smothers   TONSILLECTOMY  03-19-2006    Family History  Problem Relation Age of Onset   Diabetes Mellitus II Mother    COPD Mother    Arthritis Mother    Stroke Father    Hypertension Father    Brain cancer Father        meningioma-died 03-19-13  Diabetes Father     Social History   Socioeconomic History   Marital status: Single    Spouse name: Not on file   Number of children: Not on file   Years of education: Not on file   Highest education level: Not on file  Occupational History   Not on file  Tobacco Use   Smoking status: Every Day    Types: Cigarettes   Smokeless tobacco: Never  Vaping Use   Vaping Use: Never  used  Substance and Sexual Activity   Alcohol use: Yes    Comment: occasionally   Drug use: No   Sexual activity: Not Currently    Birth control/protection: Pill  Other Topics Concern   Not on file  Social History Narrative   Not on file   Social Determinants of Health   Financial Resource Strain: Not on file  Food Insecurity: Not on file  Transportation Needs: Not on file  Physical Activity: Not on file  Stress: Not on file  Social Connections: Not on file  Intimate Partner Violence: Not on file    ROS Review of Systems  Constitutional:  Negative for activity change, appetite change, chills, fatigue and fever.  HENT:  Negative for congestion, postnasal drip, rhinorrhea, sinus pressure, sinus pain, sneezing and sore throat.   Eyes: Negative.   Respiratory:  Negative for cough, chest tightness, shortness of breath and wheezing.   Cardiovascular:  Negative for chest pain and palpitations.  Gastrointestinal:  Negative for abdominal pain, constipation, diarrhea, nausea and vomiting.  Endocrine: Negative for cold intolerance, heat intolerance, polydipsia and polyuria.  Genitourinary:  Negative for dyspareunia, dysuria, flank pain, frequency and urgency.  Musculoskeletal:  Positive for  arthralgias. Negative for back pain and myalgias.       Tenderness of both wrists and tenderness along the outer aspect of the fifth finger of both hands.  Having trouble making a fist and holding things with both hands.  Skin:  Negative for rash.  Allergic/Immunologic: Negative for environmental allergies.  Neurological:  Negative for dizziness, weakness and headaches.  Hematological:  Negative for adenopathy.  Psychiatric/Behavioral:  The patient is nervous/anxious.    Objective:   Today's Vitals   11/13/21 1442  BP: 105/70  Pulse: 77  Temp: 98.1 F (36.7 C)  SpO2: 99%  Weight: 223 lb 6.4 oz (101.3 kg)  Height: 5\' 4"  (1.626 m)   Body mass index is 38.35 kg/m.   Physical Exam Vitals  and nursing note reviewed.  Constitutional:      Appearance: Normal appearance. She is well-developed. She is obese.  HENT:     Head: Normocephalic and atraumatic.     Nose: Nose normal.     Mouth/Throat:     Mouth: Mucous membranes are moist.     Pharynx: Oropharynx is clear.  Eyes:     Extraocular Movements: Extraocular movements intact.     Conjunctiva/sclera: Conjunctivae normal.     Pupils: Pupils are equal, round, and reactive to light.  Cardiovascular:     Rate and Rhythm: Normal rate and regular rhythm.     Pulses: Normal pulses.     Heart sounds: Normal heart sounds.  Pulmonary:     Effort: Pulmonary effort is normal.     Breath sounds: Normal breath sounds.  Abdominal:     Palpations: Abdomen is soft.  Musculoskeletal:        General: Normal range of motion.     Cervical back: Normal range of motion and neck supple.     Comments: Tenderness of both wrist and along the outer aspect of both hands.  Mild swelling present of the medial phalange of both hands.  Grip strength is slightly decreased bilaterally.  Lymphadenopathy:     Cervical: No cervical adenopathy.  Skin:    General: Skin is warm and dry.     Capillary Refill: Capillary refill takes less than 2 seconds.  Neurological:     General: No focal deficit present.     Mental Status: She is alert and oriented to person, place, and time.  Psychiatric:        Mood and Affect: Mood normal.        Behavior: Behavior normal.        Thought Content: Thought content normal.        Judgment: Judgment normal.    Assessment & Plan:  1. Encounter to establish care Appointment today to establish new primary care provider.  We will get records from previous primary care provider appointment review and update patient chart.  2. Bilateral carpal tunnel syndrome Start meloxicam 7.5 mg tablets daily as needed for inflammation and pain.  New prescription for carpal tunnel splints given to patient.  She should wear this at  night and take off during the day.  She should participate in gentle range of motion activities to prevent joint stiffness and grip weakness. - Elastic Bandages & Supports (CARPAL TUNNEL WRIST STABILIZER) MISC; Bilateral carpal tunnel splints to wear at night.  Dispense: 2 each; Refill: 1 - meloxicam (MOBIC) 7.5 MG tablet; Take 1 tablet (7.5 mg total) by mouth daily.  Dispense: 30 tablet; Refill: 2  3. Body mass index (BMI) of 38.0-38.9 in adult  Discussed lowering calorie intake to 1500 calories per day and incorporating exercise into daily routine to help lose weight. Will monitor.    Problem List Items Addressed This Visit       Nervous and Auditory   Bilateral carpal tunnel syndrome   Relevant Medications   Elastic Bandages & Supports (CARPAL TUNNEL WRIST STABILIZER) MISC   meloxicam (MOBIC) 7.5 MG tablet     Other   Body mass index (BMI) of 38.0-38.9 in adult   Other Visit Diagnoses     Encounter to establish care    -  Primary       Outpatient Encounter Medications as of 11/13/2021  Medication Sig   busPIRone (BUSPAR) 10 MG tablet TAKE 1/2 TO 1 TABLET BY MOUTH TWICE DAILY AS NEEDED FOR ANXIETY   Elastic Bandages & Supports (CARPAL TUNNEL WRIST STABILIZER) MISC Bilateral carpal tunnel splints to wear at night.   meloxicam (MOBIC) 7.5 MG tablet Take 1 tablet (7.5 mg total) by mouth daily.   norethindrone (MICRONOR) 0.35 MG tablet Take 1 tablet (0.35 mg total) by mouth daily.   valACYclovir (VALTREX) 500 MG tablet Take 1 tablet (500 mg total) by mouth daily.   No facility-administered encounter medications on file as of 11/13/2021.    Follow-up: Return in about 4 weeks (around 12/11/2021) for health maintenance exam, FBW at time of visit - see below .   [name], NP  This note was dictated using Conservation officer, historic buildings. Rapid proofreading was performed to expedite the delivery of the information. Despite proofreading, phonetic errors will occur which are  common with this voice recognition software. Please take this into consideration. If there are any concerns, please contact our office.

## 2021-11-20 ENCOUNTER — Encounter: Payer: Self-pay | Admitting: Internal Medicine

## 2021-11-24 DIAGNOSIS — Z6839 Body mass index (BMI) 39.0-39.9, adult: Secondary | ICD-10-CM | POA: Insufficient documentation

## 2021-11-24 DIAGNOSIS — G5603 Carpal tunnel syndrome, bilateral upper limbs: Secondary | ICD-10-CM | POA: Insufficient documentation

## 2021-11-24 NOTE — Patient Instructions (Signed)
Fat and Cholesterol Restricted Eating Plan Getting too much fat and cholesterol in your diet may cause health problems. Choosing the right foods helps keep your fat and cholesterol at normal levels. This can keep you from getting certain diseases. Your doctor may recommend an eating plan that includes: Total fat: ______% or less of total calories a day. This is ______g of fat a day. Saturated fat: ______% or less of total calories a day. This is ______g of saturated fat a day. Cholesterol: less than _________mg a day. Fiber: ______g a day. What are tips for following this plan? General tips Work with your doctor to lose weight if you need to. Avoid: Foods with added sugar. Fried foods. Foods with trans fat or partially hydrogenated oils. This includes some margarines and baked goods. If you drink alcohol: Limit how much you have to: 0-1 drink a day for women who are not pregnant. 0-2 drinks a day for men. Know how much alcohol is in a drink. In the [place] one drink equals one 12 oz bottle of beer (355 mL), one 5 oz glass of wine (148 mL), or one 1 oz glass of hard liquor (44 mL). Reading food labels Check food labels for: Trans fats. Partially hydrogenated oils. Saturated fat (g) in each serving. Cholesterol (mg) in each serving. Fiber (g) in each serving. Choose foods with healthy fats, such as: Monounsaturated fats and polyunsaturated fats. These include olive and canola oil, flaxseeds, walnuts, almonds, and seeds. Omega-3 fats. These are found in certain fish, flaxseed oil, and ground flaxseeds. Choose grain products that have whole grains. Look for the word "whole" as the first word in the ingredient list. Cooking Cook foods using low-fat methods. These include baking, boiling, grilling, and broiling. Eat more home-cooked foods. Eat at restaurants and buffets less often. Eat less fast food. Avoid cooking using saturated fats, such as butter, cream, palm oil, palm kernel oil, and  coconut oil. Meal planning  At meals, divide your plate into four equal parts: Fill one-half of your plate with vegetables, green salads, and fruit. Fill one-fourth of your plate with whole grains. Fill one-fourth of your plate with low-fat (lean) protein foods. Eat fish that is high in omega-3 fats at least two times a week. This includes mackerel, tuna, sardines, and salmon. Eat foods that are high in fiber, such as whole grains, beans, apples, pears, berries, broccoli, carrots, peas, and barley. What foods should I eat? Fruits All fresh, canned (in natural juice), or frozen fruits. Vegetables Fresh or frozen vegetables (raw, steamed, roasted, or grilled). Green salads. Grains Whole grains, such as whole wheat or whole grain breads, crackers, cereals, and pasta. Unsweetened oatmeal, bulgur, barley, quinoa, or brown rice. Corn or whole wheat flour tortillas. Meats and other protein foods Ground beef (85% or leaner), grass-fed beef, or beef trimmed of fat. Skinless chicken or turkey. Ground chicken or turkey. Pork trimmed of fat. All fish and seafood. Egg whites. Dried beans, peas, or lentils. Unsalted nuts or seeds. Unsalted canned beans. Nut butters without added sugar or oil. Dairy Low-fat or nonfat dairy products, such as skim or 1% milk, 2% or reduced-fat cheeses, low-fat and fat-free ricotta or cottage cheese, or plain low-fat and nonfat yogurt. Fats and oils Tub margarine without trans fats. Light or reduced-fat mayonnaise and salad dressings. Avocado. Olive, canola, sesame, or safflower oils. The items listed above may not be a complete list of foods and beverages you can eat. Contact a dietitian for more information. What foods   should I avoid? Fruits Canned fruit in heavy syrup. Fruit in cream or butter sauce. Fried fruit. Vegetables Vegetables cooked in cheese, cream, or butter sauce. Fried vegetables. Grains White bread. White pasta. White rice. Cornbread. Bagels, pastries,  and croissants. Crackers and snack foods that contain trans fat and hydrogenated oils. Meats and other protein foods Fatty cuts of meat. Ribs, chicken wings, bacon, sausage, bologna, salami, chitterlings, fatback, hot dogs, bratwurst, and packaged lunch meats. Liver and organ meats. Whole eggs and egg yolks. Chicken and turkey with skin. Fried meat. Dairy Whole or 2% milk, cream, half-and-half, and cream cheese. Whole milk cheeses. Whole-fat or sweetened yogurt. Full-fat cheeses. Nondairy creamers and whipped toppings. Processed cheese, cheese spreads, and cheese curds. Fats and oils Butter, stick margarine, lard, shortening, ghee, or bacon fat. Coconut, palm kernel, and palm oils. Beverages Alcohol. Sugar-sweetened drinks such as sodas, lemonade, and fruit drinks. Sweets and desserts Corn syrup, sugars, honey, and molasses. Candy. Jam and jelly. Syrup. Sweetened cereals. Cookies, pies, cakes, donuts, muffins, and ice cream. The items listed above may not be a complete list of foods and beverages you should avoid. Contact a dietitian for more information. Summary Choosing the right foods helps keep your fat and cholesterol at normal levels. This can keep you from getting certain diseases. At meals, fill one-half of your plate with vegetables, green salads, and fruits. Eat high fiber foods, like whole grains, beans, apples, pears, berries, carrots, peas, and barley. Limit added sugar, saturated fats, alcohol, and fried foods. This information is not intended to replace advice given to you by your health care provider. Make sure you discuss any questions you have with your health care provider. Document Revised: 03/28/2021 Document Reviewed: 03/28/2021 Elsevier Patient Education  2022 [employer] Inc[employer]  

## 2021-12-12 ENCOUNTER — Encounter: Payer: BLUE CROSS/BLUE SHIELD | Admitting: Nurse Practitioner

## 2021-12-22 ENCOUNTER — Encounter: Payer: Self-pay | Admitting: Obstetrics and Gynecology

## 2021-12-23 ENCOUNTER — Other Ambulatory Visit: Payer: Self-pay | Admitting: Obstetrics and Gynecology

## 2021-12-23 DIAGNOSIS — B9689 Other specified bacterial agents as the cause of diseases classified elsewhere: Secondary | ICD-10-CM

## 2021-12-23 MED ORDER — METRONIDAZOLE 500 MG PO TABS: 500.0000 mg | ORAL_TABLET | Freq: Two times a day (BID) | ORAL | 0 refills | Status: AC

## 2021-12-23 NOTE — Progress Notes (Signed)
Rx flagyl for recurrent BV sx after detergent change

## 2022-01-01 ENCOUNTER — Other Ambulatory Visit: Payer: Self-pay

## 2022-01-01 DIAGNOSIS — Z7689 Persons encountering health services in other specified circumstances: Secondary | ICD-10-CM

## 2022-01-01 DIAGNOSIS — E559 Vitamin D deficiency, unspecified: Secondary | ICD-10-CM

## 2022-01-01 DIAGNOSIS — E039 Hypothyroidism, unspecified: Secondary | ICD-10-CM

## 2022-01-01 DIAGNOSIS — E611 Iron deficiency: Secondary | ICD-10-CM

## 2022-01-02 ENCOUNTER — Other Ambulatory Visit: Payer: Self-pay

## 2022-01-02 ENCOUNTER — Encounter: Payer: Self-pay | Admitting: Nurse Practitioner

## 2022-01-02 ENCOUNTER — Ambulatory Visit (INDEPENDENT_AMBULATORY_CARE_PROVIDER_SITE_OTHER): Payer: BLUE CROSS/BLUE SHIELD | Admitting: Nurse Practitioner

## 2022-01-02 VITALS — BP 125/83 | HR 90 | Temp 98.5°F | Ht 64.0 in | Wt 229.0 lb

## 2022-01-02 DIAGNOSIS — Z8639 Personal history of other endocrine, nutritional and metabolic disease: Secondary | ICD-10-CM | POA: Insufficient documentation

## 2022-01-02 DIAGNOSIS — E559 Vitamin D deficiency, unspecified: Secondary | ICD-10-CM

## 2022-01-02 DIAGNOSIS — G5603 Carpal tunnel syndrome, bilateral upper limbs: Secondary | ICD-10-CM | POA: Diagnosis not present

## 2022-01-02 DIAGNOSIS — E611 Iron deficiency: Secondary | ICD-10-CM

## 2022-01-02 DIAGNOSIS — Z7689 Persons encountering health services in other specified circumstances: Secondary | ICD-10-CM

## 2022-01-02 DIAGNOSIS — Z720 Tobacco use: Secondary | ICD-10-CM

## 2022-01-02 DIAGNOSIS — Z0001 Encounter for general adult medical examination with abnormal findings: Secondary | ICD-10-CM

## 2022-01-02 DIAGNOSIS — R7989 Other specified abnormal findings of blood chemistry: Secondary | ICD-10-CM

## 2022-01-02 DIAGNOSIS — E039 Hypothyroidism, unspecified: Secondary | ICD-10-CM

## 2022-01-02 DIAGNOSIS — Z6839 Body mass index (BMI) 39.0-39.9, adult: Secondary | ICD-10-CM

## 2022-01-02 MED ORDER — NICOTINE 14 MG/24HR TD PT24: 14.0000 mg | MEDICATED_PATCH | Freq: Every day | TRANSDERMAL | 2 refills | Status: DC

## 2022-01-02 MED ORDER — BUPROPION HCL 75 MG PO TABS: 75.0000 mg | ORAL_TABLET | Freq: Two times a day (BID) | ORAL | 2 refills | Status: DC

## 2022-01-02 NOTE — Progress Notes (Signed)
Complete physical exam   Patient: Marilyn Li   DOB: March 08, 1986   35 y.o. Female  MRN: [id number] Visit Date: 01/02/2022    Chief Complaint  Patient presents with   Annual Exam   Subjective    Marilyn Li is a 36 y.o. female who presents today for a complete physical exam.  She reports consuming a generally healthy diet. The patient has a physically strenuous job, but has no regular exercise apart from work.  She generally feels well. She does have additional problems to discuss today.   HPI  The patient continues to have trouble with carpal tunnel syndrome, bilaterally. Prescription for wrist splints was given at initial visit. Has been unable to find them anywhere. Pain with movement is getting worse. Uses her hands a great deal at work and wants to keep this from getting worse.  The patient is concerned about smoking  cessation. Currently smokes about 1/2 pack of cigarettes per day. Has tried the patch in the past and has been successful.  Concerned about weight. Having trouble with managing her weight.   Past Medical History:  Diagnosis Date   Anxiety and depression    Ectopic pregnancy    Obesity    Recurrent vaginitis    Past Surgical History:  Procedure Laterality Date   ADENOIDECTOMY  65   DILATION AND CURETTAGE OF UTERUS  04/07/2009   ECTOPIC PREGNANCY SURGERY  Apr 07, 2005   Dr Toya Smothers   TONSILLECTOMY  07-Apr-2006   Social History   Socioeconomic History   Marital status: Single    Spouse name: Not on file   Number of children: Not on file   Years of education: Not on file   Highest education level: Not on file  Occupational History   Not on file  Tobacco Use   Smoking status: Every Day    Types: Cigarettes   Smokeless tobacco: Never  Vaping Use   Vaping Use: Never used  Substance and Sexual Activity   Alcohol use: Yes    Comment: occasionally   Drug use: No   Sexual activity: Not Currently    Birth control/protection: Pill  Other Topics Concern    Not on file  Social History Narrative   Not on file   Social Determinants of Health   Financial Resource Strain: Not on file  Food Insecurity: Not on file  Transportation Needs: Not on file  Physical Activity: Not on file  Stress: Not on file  Social Connections: Not on file  Intimate Partner Violence: Not on file   Family Status  Relation Name Status   Mother  Alive   Father  Deceased   Family History  Problem Relation Age of Onset   Diabetes Mellitus II Mother    COPD Mother    Arthritis Mother    Stroke Father    Hypertension Father    Brain cancer Father        meningioma-died 04/07/2013  Diabetes Father    No Known Allergies  Patient Care Team: [name], NP as PCP - General (Family Medicine)   Medications: Outpatient Medications Prior to Visit  Medication Sig   busPIRone (BUSPAR) 10 MG tablet TAKE 1/2 TO 1 TABLET BY MOUTH TWICE DAILY AS NEEDED FOR ANXIETY   Elastic Bandages & Supports (CARPAL TUNNEL WRIST STABILIZER) MISC Bilateral carpal tunnel splints to wear at night.   meloxicam (MOBIC) 7.5 MG tablet Take 1 tablet (7.5 mg total) by mouth daily.   norethindrone (MICRONOR)  0.35 MG tablet Take 1 tablet (0.35 mg total) by mouth daily.   valACYclovir (VALTREX) 500 MG tablet Take 1 tablet (500 mg total) by mouth daily.   No facility-administered medications prior to visit.    Review of Systems  Constitutional:  Positive for fatigue. Negative for activity change, appetite change, chills and fever.  HENT:  Negative for congestion, postnasal drip, rhinorrhea, sinus pressure, sinus pain, sneezing and sore throat.   Eyes: Negative.   Respiratory:  Negative for cough, chest tightness, shortness of breath and wheezing.   Cardiovascular:  Negative for chest pain and palpitations.  Gastrointestinal:  Negative for abdominal pain, constipation, diarrhea, nausea and vomiting.  Endocrine: Negative for cold intolerance, heat intolerance, polydipsia and polyuria.   Genitourinary:  Negative for dyspareunia, dysuria, flank pain, frequency and urgency.  Musculoskeletal:  Positive for arthralgias and joint swelling. Negative for back pain and myalgias.       Bilateral wrists   Skin:  Negative for rash.  Allergic/Immunologic: Negative for environmental allergies.  Neurological:  Negative for dizziness, weakness and headaches.  Hematological:  Negative for adenopathy.  Psychiatric/Behavioral:  The patient is not nervous/anxious.      Objective     Today's Vitals   01/02/22 1028  BP: 125/83  Pulse: 90  Temp: 98.5 F (36.9 C)  SpO2: 97%  Weight: 229 lb (103.9 kg)  Height: 5\' 4"  (1.626 m)   Body mass index is 39.31 kg/m.   BP Readings from Last 3 Encounters:  01/02/22 125/83  11/13/21 105/70  11/03/21 110/80    Wt Readings from Last 3 Encounters:  01/02/22 229 lb (103.9 kg)  11/13/21 223 lb 6.4 oz (101.3 kg)  11/03/21 227 lb (103 kg)     Physical Exam Vitals and nursing note reviewed.  Constitutional:      Appearance: Normal appearance. She is well-developed. She is obese.  HENT:     Head: Normocephalic and atraumatic.     Right Ear: Tympanic membrane, ear canal and external ear normal.     Left Ear: Tympanic membrane, ear canal and external ear normal.     Nose: Nose normal.     Mouth/Throat:     Mouth: Mucous membranes are moist.     Pharynx: Oropharynx is clear.  Eyes:     Extraocular Movements: Extraocular movements intact.     Conjunctiva/sclera: Conjunctivae normal.     Pupils: Pupils are equal, round, and reactive to light.  Cardiovascular:     Rate and Rhythm: Normal rate and regular rhythm.     Pulses: Normal pulses.     Heart sounds: Normal heart sounds.  Pulmonary:     Effort: Pulmonary effort is normal.     Breath sounds: Normal breath sounds.  Abdominal:     General: Bowel sounds are normal. There is no distension.     Palpations: Abdomen is soft. There is no mass.     Tenderness: There is no abdominal  tenderness. There is no guarding or rebound.     Hernia: No hernia is present.  Musculoskeletal:        General: Normal range of motion.     Cervical back: Normal range of motion and neck supple.  Lymphadenopathy:     Cervical: No cervical adenopathy.  Skin:    General: Skin is warm and dry.     Capillary Refill: Capillary refill takes less than 2 seconds.  Neurological:     General: No focal deficit present.     Mental Status:  She is alert and oriented to person, place, and time.  Psychiatric:        Mood and Affect: Mood normal.        Behavior: Behavior normal.        Thought Content: Thought content normal.        Judgment: Judgment normal.    Last depression screening scores PHQ 2/9 Scores 01/02/2022 11/13/2021 12/19/2020  PHQ - 2 Score 0 0 0  PHQ- 9 Score 1 1 -  Exception Documentation - - -  Not completed - - -   Last fall risk screening Fall Risk  01/02/2022  Falls in the past year? 0  Number falls in past yr: 0  Injury with Fall? 0  Risk for fall due to : No Fall Risks  Follow up Falls evaluation completed   Last Audit-C alcohol use screening Alcohol Use Disorder Test (AUDIT) 12/19/2020  1. How often do you have a drink containing alcohol? 2  2. How many drinks containing alcohol do you have on a typical day when you are drinking? 0  3. How often do you have six or more drinks on one occasion? 0  AUDIT-C Score 2   A score of 3 or more in women, and 4 or more in men indicates increased risk for alcohol abuse, EXCEPT if all of the points are from question 1    Assessment & Plan    1. Encounter for general adult medical examination with abnormal findings Annual Will refer to orthopedics for further evaluation and treatment.  wellness visit today.  Routine, fasting labs are drawn during today's visit.  2. Bilateral carpal tunnel syndrome Refer to orthopedics for further evaluation and treatment. - Ambulatory referral to Orthopedic Surgery  3. Body mass index (BMI)  of 39.0-39.9 in adult Discussed lowering calorie intake to 1500 calories per day and incorporating exercise into daily routine to help lose weight.   4. Smoking trying to quit Patient smoking approximately 1/2 pack of cigarettes per day.  Start NicoDerm CQ 14 mg patches.  We will also add Wellbutrin 75 mg.  Advised her to start with 1 tablet daily.  She will take 1 tablet daily for approximately 1 week then increase to twice daily as indicated and as tolerated. - nicotine (NICODERM CQ) 14 mg/24hr patch; Place 1 patch (14 mg total) onto the skin daily.  Dispense: 30 patch; Refill: 2 - buPROPion (WELLBUTRIN) 75 MG tablet; Take 1 tablet (75 mg total) by mouth 2 (two) times daily.  Dispense: 60 tablet; Refill: 2  5. H/O iron deficiency Check blood counts along with B12, ferritin, and folate  6. Low vitamin D level Check vitamin D level today and treat deficiency as indicated.   Today's Vitals   01/02/22 1028  BP: 125/83  Pulse: 90  Temp: 98.5 F (36.9 C)  SpO2: 97%  Weight: 229 lb (103.9 kg)  Height: 5\' 4"  (1.626 m)   Body mass index is 39.31 kg/m.    Immunization History  Administered Date(s) Administered   Influenza,inj,Quad PF,6+ Mos 09/13/2020   PFIZER(Purple Top)SARS-COV-2 Vaccination 09/13/2020, 10/11/2020    Health Maintenance  Topic Date Due   COVID-19 Vaccine (3 - Booster for Pfizer series) 12/06/2020   TETANUS/TDAP  01/03/2022 (Originally 03/13/2003)   INFLUENZA VACCINE  02/27/2022 (Originally 06/30/2021)   PAP SMEAR-Modifier  06/27/2022   Hepatitis C Screening  Completed   HIV Screening  Completed   HPV VACCINES  Aged Out    Discussed health benefits of physical  activity, and encouraged her to engage in regular exercise appropriate for her age and condition.  Problem List Items Addressed This Visit       Nervous and Auditory   Bilateral carpal tunnel syndrome   Relevant Medications   nicotine (NICODERM CQ) 14 mg/24hr patch   buPROPion (WELLBUTRIN) 75 MG  tablet   Other Relevant Orders   Ambulatory referral to Orthopedic Surgery     Other   Low vitamin D level   Body mass index (BMI) of 39.0-39.9 in adult   Smoking trying to quit   Relevant Medications   nicotine (NICODERM CQ) 14 mg/24hr patch   buPROPion (WELLBUTRIN) 75 MG tablet   H/O iron deficiency   Other Visit Diagnoses     Encounter for general adult medical examination with abnormal findings    -  Primary        Return in about 6 weeks (around 02/13/2022) for Mood, smoking cessation .        Carlean JewsHeather E Perkins Molina, NP  [facility] [phone] (phone) [phone] (fax)  [facility]

## 2022-01-02 NOTE — Patient Instructions (Addendum)
Preventive Care 21-39 Years Old, Female °Preventive care refers to lifestyle choices and visits with your health care provider that can promote health and wellness. Preventive care visits are also called wellness exams. °What can I expect for my preventive care visit? °Counseling °During your preventive care visit, your health care provider may ask about your: °Medical history, including: °Past medical problems. °Family medical history. °Pregnancy history. °Current health, including: °Menstrual cycle. °Method of birth control. °Emotional well-being. °Home life and relationship well-being. °Sexual activity and sexual health. °Lifestyle, including: °Alcohol, nicotine or tobacco, and drug use. °Access to firearms. °Diet, exercise, and sleep habits. °Work and work environment. °Sunscreen use. °Safety issues such as seatbelt and bike helmet use. °Physical exam °Your health care provider may check your: °Height and weight. These may be used to calculate your BMI (body mass index). BMI is a measurement that tells if you are at a healthy weight. °Waist circumference. This measures the distance around your waistline. This measurement also tells if you are at a healthy weight and may help predict your risk of certain diseases, such as type 2 diabetes and high blood pressure. °Heart rate and blood pressure. °Body temperature. °Skin for abnormal spots. °What immunizations do I need? °Vaccines are usually given at various ages, according to a schedule. Your health care provider will recommend vaccines for you based on your age, medical history, and lifestyle or other factors, such as travel or where you work. °What tests do I need? °Screening °Your health care provider may recommend screening tests for certain conditions. This may include: °Pelvic exam and Pap test. °Lipid and cholesterol levels. °Diabetes screening. This is done by checking your blood sugar (glucose) after you have not eaten for a while (fasting). °Hepatitis B  test. °Hepatitis C test. °HIV (human immunodeficiency virus) test. °STI (sexually transmitted infection) testing, if you are at risk. °BRCA-related cancer screening. This may be done if you have a family history of breast, ovarian, tubal, or peritoneal cancers. °Talk with your health care provider about your test results, treatment options, and if necessary, the need for more tests. °Follow these instructions at home: °Eating and drinking ° °Eat a healthy diet that includes fresh fruits and vegetables, whole grains, lean protein, and low-fat dairy products. °Take vitamin and mineral supplements as recommended by your health care provider. °Do not drink alcohol if: °Your health care provider tells you not to drink. °You are pregnant, may be pregnant, or are planning to become pregnant. °If you drink alcohol: °Limit how much you have to 0-1 drink a day. °Know how much alcohol is in your drink. In the [place] one drink equals one 12 oz bottle of beer (355 mL), one 5 oz glass of wine (148 mL), or one 1½ oz glass of hard liquor (44 mL). °Lifestyle °Brush your teeth every morning and night with fluoride toothpaste. Floss one time each day. °Exercise for at least 30 minutes 5 or more days each week. °Do not use any products that contain nicotine or tobacco. These products include cigarettes, chewing tobacco, and vaping devices, such as e-cigarettes. If you need help quitting, ask your health care provider. °Do not use drugs. °If you are sexually active, practice safe sex. Use a condom or other form of protection to prevent STIs. °If you do not wish to become pregnant, use a form of birth control. If you plan to become pregnant, see your health care provider for a prepregnancy visit. °Find healthy ways to manage stress, such as: °Meditation, yoga,   or listening to music. Journaling. Talking to a trusted person. Spending time with friends and family. Minimize exposure to UV radiation to reduce your risk of skin  cancer. Safety Always wear your seat belt while driving or riding in a vehicle. Do not drive: If you have been drinking alcohol. Do not ride with someone who has been drinking. If you have been using any mind-altering substances or drugs. While texting. When you are tired or distracted. Wear a helmet and other protective equipment during sports activities. If you have firearms in your house, make sure you follow all gun safety procedures. Seek help if you have been physically or sexually abused. What's next? Go to your health care provider once a year for an annual wellness visit. Ask your health care provider how often you should have your eyes and teeth checked. Stay up to date on all vaccines. This information is not intended to replace advice given to you by your health care provider. Make sure you discuss any questions you have with your health care provider. Document Revised: 05/14/2021 Document Reviewed: 05/14/2021 Elsevier Patient Education  2022 [place] and Cholesterol Restricted Eating Plan Getting too much fat and cholesterol in your diet may cause health problems. Choosing the right foods helps keep your fat and cholesterol at normal levels. This can keep you from getting certain diseases. Your doctor may recommend an eating plan that includes: Total fat: ______% or less of total calories a day. This is ______g of fat a day. Saturated fat: ______% or less of total calories a day. This is ______g of saturated fat a day. Cholesterol: less than _________mg a day. Fiber: ______g a day. What are tips for following this plan? General tips Work with your doctor to lose weight if you need to. Avoid: Foods with added sugar. Fried foods. Foods with trans fat or partially hydrogenated oils. This includes some margarines and baked goods. If you drink alcohol: Limit how much you have to: 0-1 drink a day for women who are not pregnant. 0-2 drinks a day for men. Know how  much alcohol is in a drink. In the [place] one drink equals one 12 oz bottle of beer (355 mL), one 5 oz glass of wine (148 mL), or one 1 oz glass of hard liquor (44 mL). Reading food labels Check food labels for: Trans fats. Partially hydrogenated oils. Saturated fat (g) in each serving. Cholesterol (mg) in each serving. Fiber (g) in each serving. Choose foods with healthy fats, such as: Monounsaturated fats and polyunsaturated fats. These include olive and canola oil, flaxseeds, walnuts, almonds, and seeds. Omega-3 fats. These are found in certain fish, flaxseed oil, and ground flaxseeds. Choose grain products that have whole grains. Look for the word "whole" as the first word in the ingredient list. Cooking Cook foods using low-fat methods. These include baking, boiling, grilling, and broiling. Eat more home-cooked foods. Eat at restaurants and buffets less often. Eat less fast food. Avoid cooking using saturated fats, such as butter, cream, palm oil, palm kernel oil, and coconut oil. Meal planning  At meals, divide your plate into four equal parts: Fill one-half of your plate with vegetables, green salads, and fruit. Fill one-fourth of your plate with whole grains. Fill one-fourth of your plate with low-fat (lean) protein foods. Eat fish that is high in omega-3 fats at least two times a week. This includes mackerel, tuna, sardines, and salmon. Eat foods that are high in fiber, such as whole grains, beans,  apples, pears, berries, broccoli, carrots, peas, and barley. What foods should I eat? Fruits All fresh, canned (in natural juice), or frozen fruits. Vegetables Fresh or frozen vegetables (raw, steamed, roasted, or grilled). Green salads. Grains Whole grains, such as whole wheat or whole grain breads, crackers, cereals, and pasta. Unsweetened oatmeal, bulgur, barley, quinoa, or brown rice. Corn or whole wheat flour tortillas. Meats and other protein foods Ground beef (85% or  leaner), grass-fed beef, or beef trimmed of fat. Skinless chicken or [place]. Ground chicken or [place]. Pork trimmed of fat. All fish and seafood. Egg whites. Dried beans, peas, or lentils. Unsalted nuts or seeds. Unsalted canned beans. Nut butters without added sugar or oil. Dairy Low-fat or nonfat dairy products, such as skim or 1% milk, 2% or reduced-fat cheeses, low-fat and fat-free ricotta or cottage cheese, or plain low-fat and nonfat yogurt. Fats and oils Tub margarine without trans fats. Light or reduced-fat mayonnaise and salad dressings. Avocado. Olive, canola, sesame, or safflower oils. The items listed above may not be a complete list of foods and beverages you can eat. Contact a dietitian for more information. What foods should I avoid? Fruits Canned fruit in heavy syrup. Fruit in cream or butter sauce. Fried fruit. Vegetables Vegetables cooked in cheese, cream, or butter sauce. Fried vegetables. Grains White bread. White pasta. White rice. Cornbread. Bagels, pastries, and croissants. Crackers and snack foods that contain trans fat and hydrogenated oils. Meats and other protein foods Fatty cuts of meat. Ribs, chicken wings, bacon, sausage, bologna, salami, chitterlings, fatback, hot dogs, bratwurst, and packaged lunch meats. Liver and organ meats. Whole eggs and egg yolks. Chicken and [place] with skin. Fried meat. Dairy Whole or 2% milk, cream, half-and-half, and cream cheese. Whole milk cheeses. Whole-fat or sweetened yogurt. Full-fat cheeses. Nondairy creamers and whipped toppings. Processed cheese, cheese spreads, and cheese curds. Fats and oils Butter, stick margarine, lard, shortening, ghee, or bacon fat. Coconut, palm kernel, and palm oils. Beverages Alcohol. Sugar-sweetened drinks such as sodas, lemonade, and fruit drinks. Sweets and desserts Corn syrup, sugars, honey, and molasses. Candy. Jam and jelly. Syrup. Sweetened cereals. Cookies, pies, cakes, donuts, muffins, and ice  cream. The items listed above may not be a complete list of foods and beverages you should avoid. Contact a dietitian for more information. Summary Choosing the right foods helps keep your fat and cholesterol at normal levels. This can keep you from getting certain diseases. At meals, fill one-half of your plate with vegetables, green salads, and fruits. Eat high fiber foods, like whole grains, beans, apples, pears, berries, carrots, peas, and barley. Limit added sugar, saturated fats, alcohol, and fried foods. This information is not intended to replace advice given to you by your health care provider. Make sure you discuss any questions you have with your health care provider. Document Revised: 03/28/2021 Document Reviewed: 03/28/2021 Elsevier Patient Education  2022 [place] and Cholesterol Restricted Eating Plan Getting too much fat and cholesterol in your diet may cause health problems. Choosing the right foods helps keep your fat and cholesterol at normal levels. This can keep you from getting certain diseases. Your doctor may recommend an eating plan that includes: Total fat: ______% or less of total calories a day. This is ______g of fat a day. Saturated fat: ______% or less of total calories a day. This is ______g of saturated fat a day. Cholesterol: less than _________mg a day. Fiber: ______g a day. What are tips for following this plan? General tips  Work with your doctor to lose weight if you need to. Avoid: Foods with added sugar. Fried foods. Foods with trans fat or partially hydrogenated oils. This includes some margarines and baked goods. If you drink alcohol: Limit how much you have to: 0-1 drink a day for women who are not pregnant. 0-2 drinks a day for men. Know how much alcohol is in a drink. In the [place] one drink equals one 12 oz bottle of beer (355 mL), one 5 oz glass of wine (148 mL), or one 1 oz glass of hard liquor (44 mL). Reading food labels Check  food labels for: Trans fats. Partially hydrogenated oils. Saturated fat (g) in each serving. Cholesterol (mg) in each serving. Fiber (g) in each serving. Choose foods with healthy fats, such as: Monounsaturated fats and polyunsaturated fats. These include olive and canola oil, flaxseeds, walnuts, almonds, and seeds. Omega-3 fats. These are found in certain fish, flaxseed oil, and ground flaxseeds. Choose grain products that have whole grains. Look for the word "whole" as the first word in the ingredient list. Cooking Cook foods using low-fat methods. These include baking, boiling, grilling, and broiling. Eat more home-cooked foods. Eat at restaurants and buffets less often. Eat less fast food. Avoid cooking using saturated fats, such as butter, cream, palm oil, palm kernel oil, and coconut oil. Meal planning  At meals, divide your plate into four equal parts: Fill one-half of your plate with vegetables, green salads, and fruit. Fill one-fourth of your plate with whole grains. Fill one-fourth of your plate with low-fat (lean) protein foods. Eat fish that is high in omega-3 fats at least two times a week. This includes mackerel, tuna, sardines, and salmon. Eat foods that are high in fiber, such as whole grains, beans, apples, pears, berries, broccoli, carrots, peas, and barley. What foods should I eat? Fruits All fresh, canned (in natural juice), or frozen fruits. Vegetables Fresh or frozen vegetables (raw, steamed, roasted, or grilled). Green salads. Grains Whole grains, such as whole wheat or whole grain breads, crackers, cereals, and pasta. Unsweetened oatmeal, bulgur, barley, quinoa, or brown rice. Corn or whole wheat flour tortillas. Meats and other protein foods Ground beef (85% or leaner), grass-fed beef, or beef trimmed of fat. Skinless chicken or [place]. Ground chicken or [place]. Pork trimmed of fat. All fish and seafood. Egg whites. Dried beans, peas, or lentils. Unsalted nuts  or seeds. Unsalted canned beans. Nut butters without added sugar or oil. Dairy Low-fat or nonfat dairy products, such as skim or 1% milk, 2% or reduced-fat cheeses, low-fat and fat-free ricotta or cottage cheese, or plain low-fat and nonfat yogurt. Fats and oils Tub margarine without trans fats. Light or reduced-fat mayonnaise and salad dressings. Avocado. Olive, canola, sesame, or safflower oils. The items listed above may not be a complete list of foods and beverages you can eat. Contact a dietitian for more information. What foods should I avoid? Fruits Canned fruit in heavy syrup. Fruit in cream or butter sauce. Fried fruit. Vegetables Vegetables cooked in cheese, cream, or butter sauce. Fried vegetables. Grains White bread. White pasta. White rice. Cornbread. Bagels, pastries, and croissants. Crackers and snack foods that contain trans fat and hydrogenated oils. Meats and other protein foods Fatty cuts of meat. Ribs, chicken wings, bacon, sausage, bologna, salami, chitterlings, fatback, hot dogs, bratwurst, and packaged lunch meats. Liver and organ meats. Whole eggs and egg yolks. Chicken and [place] with skin. Fried meat. Dairy Whole or 2% milk, cream, half-and-half, and cream cheese.  Whole milk cheeses. Whole-fat or sweetened yogurt. Full-fat cheeses. Nondairy creamers and whipped toppings. Processed cheese, cheese spreads, and cheese curds. Fats and oils Butter, stick margarine, lard, shortening, ghee, or bacon fat. Coconut, palm kernel, and palm oils. Beverages Alcohol. Sugar-sweetened drinks such as sodas, lemonade, and fruit drinks. Sweets and desserts Corn syrup, sugars, honey, and molasses. Candy. Jam and jelly. Syrup. Sweetened cereals. Cookies, pies, cakes, donuts, muffins, and ice cream. The items listed above may not be a complete list of foods and beverages you should avoid. Contact a dietitian for more information. Summary Choosing the right foods helps keep your fat and  cholesterol at normal levels. This can keep you from getting certain diseases. At meals, fill one-half of your plate with vegetables, green salads, and fruits. Eat high fiber foods, like whole grains, beans, apples, pears, berries, carrots, peas, and barley. Limit added sugar, saturated fats, alcohol, and fried foods. This information is not intended to replace advice given to you by your health care provider. Make sure you discuss any questions you have with your health care provider. Document Revised: 03/28/2021 Document Reviewed: 03/28/2021 Elsevier Patient Education  [place].

## 2022-01-03 LAB — COMPREHENSIVE METABOLIC PANEL
ALT: 10 IU/L (ref 0–32)
AST: 18 IU/L (ref 0–40)
Albumin/Globulin Ratio: 1.6 (ref 1.2–2.2)
Albumin: 4 g/dL (ref 3.8–4.8)
Alkaline Phosphatase: 48 IU/L (ref 44–121)
BUN/Creatinine Ratio: 11 (ref 9–23)
BUN: 10 mg/dL (ref 6–20)
Bilirubin Total: 0.5 mg/dL (ref 0.0–1.2)
CO2: 23 mmol/L (ref 20–29)
Calcium: 8.6 mg/dL — ABNORMAL LOW (ref 8.7–10.2)
Chloride: 106 mmol/L (ref 96–106)
Creatinine, Ser: 0.87 mg/dL (ref 0.57–1.00)
Globulin, Total: 2.5 g/dL (ref 1.5–4.5)
Glucose: 85 mg/dL (ref 70–99)
Potassium: 4.5 mmol/L (ref 3.5–5.2)
Sodium: 140 mmol/L (ref 134–144)
Total Protein: 6.5 g/dL (ref 6.0–8.5)
eGFR: 88 mL/min/{1.73_m2} (ref >59)

## 2022-01-03 LAB — CBC
Hematocrit: 38.4 % (ref 34.0–46.6)
Hemoglobin: 13.2 g/dL (ref 11.1–15.9)
MCH: 31 pg (ref 26.6–33.0)
MCHC: 34.4 g/dL (ref 31.5–35.7)
MCV: 90 fL (ref 79–97)
Platelets: 282 10*3/uL (ref 150–450)
RBC: 4.26 x10E6/uL (ref 3.77–5.28)
RDW: 11.8 % (ref 11.7–15.4)
WBC: 6.9 10*3/uL (ref 3.4–10.8)

## 2022-01-03 LAB — LIPID PANEL
Chol/HDL Ratio: 2.6 ratio (ref 0.0–4.4)
Cholesterol, Total: 132 mg/dL (ref 100–199)
HDL: 51 mg/dL (ref >39)
LDL Chol Calc (NIH): 70 mg/dL (ref 0–99)
Triglycerides: 51 mg/dL (ref 0–149)
VLDL Cholesterol Cal: 11 mg/dL (ref 5–40)

## 2022-01-03 LAB — FERRITIN: Ferritin: 38 ng/mL (ref 15–150)

## 2022-01-03 LAB — VITAMIN D 25 HYDROXY (VIT D DEFICIENCY, FRACTURES): Vit D, 25-Hydroxy: 14.8 ng/mL — ABNORMAL LOW (ref 30.0–100.0)

## 2022-01-03 LAB — TSH: TSH: 1.48 u[IU]/mL (ref 0.450–4.500)

## 2022-01-03 LAB — T4, FREE: Free T4: 1.25 ng/dL (ref 0.82–1.77)

## 2022-01-03 LAB — HEMOGLOBIN A1C
Est. average glucose Bld gHb Est-mCnc: 105 mg/dL
Hgb A1c MFr Bld: 5.3 % (ref 4.8–5.6)

## 2022-01-03 LAB — FOLATE: Folate: 8 ng/mL (ref >3.0)

## 2022-01-05 ENCOUNTER — Encounter: Payer: Self-pay | Admitting: Nurse Practitioner

## 2022-01-05 ENCOUNTER — Other Ambulatory Visit: Payer: Self-pay | Admitting: Nurse Practitioner

## 2022-01-05 DIAGNOSIS — E559 Vitamin D deficiency, unspecified: Secondary | ICD-10-CM

## 2022-01-05 MED ORDER — ERGOCALCIFEROL 1.25 MG (50000 UT) PO CAPS: 50000.0000 [IU] | ORAL_CAPSULE | ORAL | 5 refills | Status: DC

## 2022-01-05 NOTE — Progress Notes (Signed)
MyChart message sent to patient.  Hello!  I wanted to let you know that your labs are back. You continue to have low calcium and vitamin d. I have restarted you on Drisdol 99242 iu which is the vitamin d supplement. This is once weekly for next few months. All other labs were good. Please let me know if you have any questions.  Have a great day! Herbert Seta

## 2022-01-05 NOTE — Progress Notes (Signed)
Added drisdol 50000iu weekly for next few months to help with deficiency of vitamin d and calcium. Prescription sent to [facility]. MyChart message sent to patient.

## 2022-01-14 ENCOUNTER — Encounter: Payer: Self-pay | Admitting: Nurse Practitioner

## 2022-01-30 ENCOUNTER — Ambulatory Visit (INDEPENDENT_AMBULATORY_CARE_PROVIDER_SITE_OTHER): Payer: BLUE CROSS/BLUE SHIELD | Admitting: Licensed Practical Nurse

## 2022-01-30 ENCOUNTER — Encounter: Payer: Self-pay | Admitting: Licensed Practical Nurse

## 2022-01-30 ENCOUNTER — Other Ambulatory Visit: Payer: Self-pay

## 2022-01-30 VITALS — BP 118/72 | Ht 64.0 in | Wt 231.0 lb

## 2022-01-30 DIAGNOSIS — N898 Other specified noninflammatory disorders of vagina: Secondary | ICD-10-CM

## 2022-01-30 DIAGNOSIS — R399 Unspecified symptoms and signs involving the genitourinary system: Secondary | ICD-10-CM

## 2022-01-30 DIAGNOSIS — R102 Pelvic and perineal pain unspecified side: Secondary | ICD-10-CM

## 2022-01-30 DIAGNOSIS — B3731 Acute candidiasis of vulva and vagina: Secondary | ICD-10-CM

## 2022-01-30 DIAGNOSIS — R319 Hematuria, unspecified: Secondary | ICD-10-CM | POA: Diagnosis not present

## 2022-01-30 LAB — POCT URINALYSIS DIPSTICK
Bilirubin, UA: NEGATIVE
Blood, UA: 7.5
Glucose, UA: NEGATIVE
Ketones, UA: NEGATIVE
Leukocytes, UA: NEGATIVE
Nitrite, UA: NEGATIVE
Protein, UA: NEGATIVE
Spec Grav, UA: >=1.03 — AB (ref 1.010–1.025)
Urobilinogen, UA: 0.2 E.U./dL
pH, UA: 5 (ref 5.0–8.0)

## 2022-01-30 MED ORDER — FLUCONAZOLE 150 MG PO TABS: 150.0000 mg | ORAL_TABLET | ORAL | 0 refills | Status: AC

## 2022-01-30 MED ORDER — CEPHALEXIN 500 MG PO CAPS: 500.0000 mg | ORAL_CAPSULE | Freq: Four times a day (QID) | ORAL | 2 refills | Status: DC

## 2022-01-30 NOTE — Progress Notes (Signed)
?HPI: ?     [name] is a 36 y.o. [phone] who LMP was Patient's last menstrual period was 01/01/2022 (exact date)., presents today for a problem visit.  She complains of: ? ?Lower Right sided pain: This pain is in the lower right side and goes to her pelvis, it is sharp and at times dull, it "comes and goes", she is able to sleep, when it occurs, it is  a "5-6/10".  She was evaluated for this same pain in 2020, her pelvic US was normal at that time. She is unable to tell if this pain is related to her cycle.  This pain has been occurring over the last "couple of days". Denies N?V?D or fevers.  Relaxing and taking Tylenol eases the pain.  ?Cycle history: occur monthly, last less than 7 days, they are heavy she goes through a box of tampons and pads during once cycle. Her most recent cycle lasted 2.5 weeks, she switched birth control at that time, so suspects that switch caused the longer cycle.  ? ?Vaginitis: Her discharge has been creamy with a fishy odor x 1 week.  She was treated for [facility] in January. 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  Currently she is not sexually active.  She does wear leggings often. She uses tampons, menstrual pad irritate her.  ? ?UTI: Has had increased urgency and frequency, painful bladder if she does not go right away, some dysuria, only urinates small amounts each void for the last 3-4 pains.  Denies flank pain.  ? ?PMHx: ?She  has a past medical history of Anxiety and depression, Ectopic pregnancy, Obesity, and Recurrent vaginitis. Also,  has a past surgical history that includes Tonsillectomy (2007); Adenoidectomy (1986); Ectopic pregnancy surgery (2006); and Dilation and curettage of uterus (2010)., family history includes Arthritis in her mother; Brain cancer in her father; COPD in her mother; Diabetes in her father; Diabetes Mellitus II in her mother; Hypertension in her father; Stroke in her father.,  reports that she has been  smoking cigarettes. She has never used smokeless tobacco. She reports current alcohol use. She reports that she does not use drugs. ? ?She has a current medication list which includes the following prescription(s): bupropion, buspirone, cephalexin, ergocalciferol, fluconazole, meloxicam, valacyclovir, carpal tunnel wrist stabilizer, nicotine, and norethindrone. Also, has No Known Allergies. ? ?Review of Systems  ?Constitutional:  Negative for chills and fever.  ?Respiratory: Negative.    ?Cardiovascular: Negative.   ?Gastrointestinal:  Positive for abdominal pain. Negative for diarrhea, nausea and vomiting.  ?Genitourinary:  Positive for dysuria, frequency and urgency. Negative for flank pain.  ?Psychiatric/Behavioral: Negative.    ? ?Objective: ?BP 118/72   Ht 5\' 4"  (1.626 m)   Wt 231 lb (104.8 kg)   LMP 01/01/2022 (Exact Date)   BMI 39.65 kg/m?  ?Physical Exam ?Constitutional:   ?   Appearance: Normal appearance.  ?Genitourinary:  ?   Vulva normal.  ?   Genitourinary Comments: Bimanual exam: non gravid uterus, non-tender, adnexa non-tender no masses  ?Cervix pink, 2 cysts at 6 O[place] and 1 cyst at 12 [address], thick white discharge adherent to vaginal walls   ?HENT:  ?   Mouth/Throat:  ?   Mouth: Mucous membranes are moist.  ?Pulmonary:  ?   Effort: Pulmonary effort is normal.  ?Abdominal:  ?   General: Abdomen is flat. There is no distension.  ?   Palpations: There is no mass.  ?  Tenderness: There is no abdominal tenderness. There is no right CVA tenderness, left CVA tenderness, guarding or rebound.  ?Neurological:  ?   Mental Status: She is alert.  ?Psychiatric:     ?   Mood and Affect: Mood normal.  ? ? ?ASSESSMENT/PLAN:   ? ?Problem List Items Addressed This Visit   ?None ?Visit Diagnoses   ? ? Pelvic pain    -  Primary  ? Relevant Orders  ? US PELVIC COMPLETE WITH TRANSVAGINAL  ? NuSwab Vaginitis Plus (VG+)  ? Genital Mycoplasmas NAA, Urine  ? UTI symptoms      ? Relevant Medications  ? cephALEXin  (KEFLEX) 500 MG capsule  ? Other Relevant Orders  ? POCT Urinalysis Dipstick (Completed)  ? Urine Culture  ? Urinalysis, microscopic only (Completed)  ? Vaginal discharge      ? Relevant Orders  ? NuSwab Vaginitis Plus (VG+)  ? Genital Mycoplasmas NAA, Urine  ? Vaginal odor      ? Relevant Orders  ? NuSwab Vaginitis Plus (VG+)  ? Genital Mycoplasmas NAA, Urine  ? Hematuria, unspecified type      ? Relevant Orders  ? Urinalysis, microscopic only (Completed)  ? Vaginal yeast infection      ? Relevant Medications  ? fluconazole (DIFLUCAN) 150 MG tablet  ? cephALEXin (KEFLEX) 500 MG capsule  ? ?  ? ? ? ?Fu based on lab and [place] results ?Warning signs/reasons to go to the ED for lower right abdominal pain reviewed.  ? ?Carie Caddy, CNM  ?[facility], [facility]  ?02/01/22  ?1:34 PM  ? ? ?  ?

## 2022-01-31 ENCOUNTER — Encounter: Payer: Self-pay | Admitting: Licensed Practical Nurse

## 2022-01-31 LAB — URINALYSIS, MICROSCOPIC ONLY
Bacteria, UA: NONE SEEN
Casts: NONE SEEN /lpf
Epithelial Cells (non renal): NONE SEEN /hpf (ref 0–10)
WBC, UA: NONE SEEN /hpf (ref 0–5)

## 2022-02-01 LAB — URINE CULTURE: Organism ID, Bacteria: NO GROWTH

## 2022-02-03 ENCOUNTER — Other Ambulatory Visit: Payer: Self-pay | Admitting: Licensed Practical Nurse

## 2022-02-03 ENCOUNTER — Encounter: Payer: Self-pay | Admitting: Licensed Practical Nurse

## 2022-02-03 DIAGNOSIS — N739 Female pelvic inflammatory disease, unspecified: Secondary | ICD-10-CM

## 2022-02-03 LAB — NUSWAB VAGINITIS PLUS (VG+)
Atopobium vaginae: HIGH Score — AB
Candida albicans, NAA: NEGATIVE
Candida glabrata, NAA: NEGATIVE
Chlamydia trachomatis, NAA: NEGATIVE
Neisseria gonorrhoeae, NAA: NEGATIVE
Trich vag by NAA: NEGATIVE

## 2022-02-03 LAB — GENITAL MYCOPLASMAS NAA, URINE
Mycoplasma genitalium NAA: NEGATIVE
Mycoplasma hominis NAA: NEGATIVE
Ureaplasma spp NAA: POSITIVE — AB

## 2022-02-03 MED ORDER — DOXYCYCLINE HYCLATE 100 MG PO CAPS: 100.0000 mg | ORAL_CAPSULE | Freq: Two times a day (BID) | ORAL | 0 refills | Status: AC

## 2022-02-03 NOTE — Progress Notes (Unsigned)
Pr seen for vaginitis and UTI symptoms, has had persistent vaginal discharge complaints. 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  ?Pt called LVM, them mychart message sent.  ? ?Carie Caddy, CNM  ?[facility], [facility]  ?02/03/22  ?12:11 PM  ? ?

## 2022-02-12 ENCOUNTER — Other Ambulatory Visit: Payer: Self-pay

## 2022-02-12 ENCOUNTER — Ambulatory Visit
Admission: RE | Admit: 2022-02-12 | Discharge: 2022-02-12 | Disposition: A | Discharge status: Home / Self Care | Payer: BLUE CROSS/BLUE SHIELD | Source: Ambulatory Visit | Attending: Licensed Practical Nurse | Admitting: Licensed Practical Nurse

## 2022-02-12 DIAGNOSIS — R102 Pelvic and perineal pain: Secondary | ICD-10-CM | POA: Insufficient documentation

## 2022-02-13 ENCOUNTER — Ambulatory Visit: Payer: BLUE CROSS/BLUE SHIELD | Admitting: Nurse Practitioner

## 2022-02-16 ENCOUNTER — Ambulatory Visit (INDEPENDENT_AMBULATORY_CARE_PROVIDER_SITE_OTHER): Payer: BLUE CROSS/BLUE SHIELD | Admitting: Nurse Practitioner

## 2022-02-16 ENCOUNTER — Other Ambulatory Visit: Payer: Self-pay

## 2022-02-16 ENCOUNTER — Encounter: Payer: Self-pay | Admitting: Nurse Practitioner

## 2022-02-16 VITALS — BP 109/72 | HR 83 | Temp 98.2°F | Ht 64.0 in | Wt 228.0 lb

## 2022-02-16 DIAGNOSIS — B379 Candidiasis, unspecified: Secondary | ICD-10-CM

## 2022-02-16 DIAGNOSIS — F17209 Nicotine dependence, unspecified, with unspecified nicotine-induced disorders: Secondary | ICD-10-CM

## 2022-02-16 DIAGNOSIS — Z6839 Body mass index (BMI) 39.0-39.9, adult: Secondary | ICD-10-CM

## 2022-02-16 DIAGNOSIS — J0140 Acute pansinusitis, unspecified: Secondary | ICD-10-CM | POA: Diagnosis not present

## 2022-02-16 DIAGNOSIS — T3695XA Adverse effect of unspecified systemic antibiotic, initial encounter: Secondary | ICD-10-CM

## 2022-02-16 LAB — POCT INFLUENZA A/B: Influenza A, POC: NEGATIVE

## 2022-02-16 MED ORDER — CEFUROXIME AXETIL 500 MG PO TABS: 500.0000 mg | ORAL_TABLET | Freq: Two times a day (BID) | ORAL | 0 refills | Status: DC

## 2022-02-16 MED ORDER — FLUCONAZOLE 150 MG PO TABS: ORAL_TABLET | ORAL | 0 refills | Status: DC

## 2022-02-16 NOTE — Progress Notes (Signed)
Established patient visit ? ? ?Patient: Marilyn Li   DOB: 1986-11-02   35 y.o. Female  MRN: [id number] ?Visit Date: 02/16/2022 ? ?Chief Complaint  ?Patient presents with  ? Sinus Problem  ? ?Subjective  ?  ?The patient states that she has taken two home tests for COVID 19 since symptoms started, and both were negative  ? ?Sinus Problem ?This is a new problem. The current episode started in the past 7 days. The problem has been gradually worsening since onset. Associated symptoms include congestion, coughing, ear pain, headaches, a hoarse voice, sinus pressure, sneezing and a sore throat. Pertinent negatives include no chills or shortness of breath. Past treatments include oral decongestants and acetaminophen. The treatment provided mild relief.   ? ? ?Medications: ?Outpatient Medications Prior to Visit  ?Medication Sig  ? buPROPion (WELLBUTRIN) 75 MG tablet Take 1 tablet (75 mg total) by mouth 2 (two) times daily.  ? busPIRone (BUSPAR) 10 MG tablet TAKE 1/2 TO 1 TABLET BY MOUTH TWICE DAILY AS NEEDED FOR ANXIETY  ? Elastic Bandages & Supports (CARPAL TUNNEL WRIST STABILIZER) MISC Bilateral carpal tunnel splints to wear at night.  ? ergocalciferol (DRISDOL) 1.25 MG (50000 UT) capsule Take 1 capsule (50,000 Units total) by mouth once a week.  ? meloxicam (MOBIC) 7.5 MG tablet Take 1 tablet (7.5 mg total) by mouth daily.  ? nicotine (NICODERM CQ) 14 mg/24hr patch Place 1 patch (14 mg total) onto the skin daily.  ? norethindrone (MICRONOR) 0.35 MG tablet Take 1 tablet (0.35 mg total) by mouth daily. (Patient not taking: Reported on 01/30/2022)  ? valACYclovir (VALTREX) 500 MG tablet Take 1 tablet (500 mg total) by mouth daily.  ? [DISCONTINUED] cephALEXin (KEFLEX) 500 MG capsule Take 1 capsule (500 mg total) by mouth 4 (four) times daily.  ? ?No facility-administered medications prior to visit.  ? ? ?Review of Systems  ?Constitutional:  Positive for activity change, fatigue and fever. Negative for appetite change and  chills.  ?HENT:  Positive for congestion, ear pain, hoarse voice, postnasal drip, rhinorrhea, sinus pressure, sinus pain, sneezing and sore throat.   ?Eyes: Negative.   ?Respiratory:  Positive for cough. Negative for chest tightness, shortness of breath and wheezing.   ?Cardiovascular:  Negative for chest pain and palpitations.  ?Gastrointestinal:  Negative for abdominal pain, constipation, diarrhea, nausea and vomiting.  ?Endocrine: Negative for cold intolerance, heat intolerance, polydipsia and polyuria.  ?Genitourinary:  Negative for dyspareunia, dysuria, flank pain, frequency and urgency.  ?Musculoskeletal:  Negative for arthralgias, back pain and myalgias.  ?Skin:  Negative for rash.  ?Allergic/Immunologic: Negative for environmental allergies.  ?Neurological:  Positive for headaches. Negative for dizziness and weakness.  ?Hematological:  Positive for adenopathy.  ?Psychiatric/Behavioral:  The patient is not nervous/anxious.   ? ? Objective  ?  ? ?Today's Vitals  ? 02/16/22 1619  ?BP: 109/72  ?Pulse: 83  ?Temp: 98.2 ?F (36.8 ?C)  ?SpO2: 99%  ?Weight: 228 lb (103.4 kg)  ?Height: 5\' 4"  (1.626 m)  ? ?Body mass index is 39.14 kg/m?.  ? ?Physical Exam ?Vitals and nursing note reviewed.  ?Constitutional:   ?   Appearance: Normal appearance. She is well-developed. She is obese. She is ill-appearing.  ?HENT:  ?   Head: Normocephalic and atraumatic.  ?   Right Ear: Tympanic membrane is erythematous and bulging.  ?   Left Ear: Tympanic membrane is erythematous and bulging.  ?   Nose: Congestion present.  ?   Right Sinus: Maxillary sinus  tenderness and frontal sinus tenderness present.  ?   Left Sinus: Maxillary sinus tenderness and frontal sinus tenderness present.  ?   Mouth/Throat:  ?   Pharynx: Posterior oropharyngeal erythema present.  ?Eyes:  ?   Pupils: Pupils are equal, round, and reactive to light.  ?Cardiovascular:  ?   Rate and Rhythm: Normal rate and regular rhythm.  ?   Pulses: Normal pulses.  ?   Heart  sounds: Normal heart sounds.  ?Pulmonary:  ?   Effort: Pulmonary effort is normal.  ?   Breath sounds: Wheezing present.  ?   Comments: Dry, harsh, non productive cough present during today's visit  ?Abdominal:  ?   Palpations: Abdomen is soft.  ?Musculoskeletal:     ?   General: Normal range of motion.  ?   Cervical back: Normal range of motion and neck supple.  ?Lymphadenopathy:  ?   Cervical: Cervical adenopathy present.  ?Skin: ?   General: Skin is warm and dry.  ?   Capillary Refill: Capillary refill takes less than 2 seconds.  ?Neurological:  ?   General: No focal deficit present.  ?   Mental Status: She is alert and oriented to person, place, and time.  ?Psychiatric:     ?   Mood and Affect: Mood normal.     ?   Behavior: Behavior normal.     ?   Thought Content: Thought content normal.     ?   Judgment: Judgment normal.  ?  ? ? ?Results for orders placed or performed in visit on 02/16/22  ?POCT Influenza A/B  ?Result Value Ref Range  ? Influenza A, POC Negative Negative  ? Influenza B, POC    ? ? Assessment & Plan  ?  ? ?1. Acute non-recurrent pansinusitis ?Patient negative for flu during today's visit. Start ceftin 500mg  twice daily for 10 days. Rest and increase fluids. Continue using OTC medication to control symptoms.   ?- cefUROXime (CEFTIN) 500 MG tablet; Take 1 tablet (500 mg total) by mouth 2 (two) times daily with a meal.  Dispense: 20 tablet; Refill: 0 ?- POCT Influenza A/B ? ?2. Antibiotic-induced yeast infection ?She may take diflucan at onset of symptoms associated with yeast infection. This may be repeated in three days for persistent symptoms.  ?- fluconazole (DIFLUCAN) 150 MG tablet; Take 1 tablet po once. May repeat dose in 3 days as needed for persistent symptoms.  Dispense: 3 tablet; Refill: 0  ? ?Return for prn worsening or persistent symptoms.  ?   ? ? ? ? , NP  ?[facility] ?[phone] (phone) ?[phone] (fax) ? ?[facility] ?

## 2022-02-17 ENCOUNTER — Other Ambulatory Visit: Payer: Self-pay | Admitting: Licensed Practical Nurse

## 2022-02-17 DIAGNOSIS — R102 Pelvic and perineal pain unspecified side: Secondary | ICD-10-CM

## 2022-02-17 NOTE — Progress Notes (Signed)
TC: Reviewed pelvic US. Your [place] wan normal, but you have had this pelvic pain for sometime, Rec GYN consult for an evaluation for endometriosis. Reports this was recommended to her in the past, but at the time she could not afford.  Is open to this procedure now.  ?Referral placed for GYN ?Carie Caddy, CNM  ?Domingo Pulse, [facility]  ?02/17/22  ?10:52 AM  ? ?

## 2022-02-19 ENCOUNTER — Encounter: Payer: Self-pay | Admitting: Nurse Practitioner

## 2022-02-20 ENCOUNTER — Other Ambulatory Visit: Payer: Self-pay | Admitting: Nurse Practitioner

## 2022-02-20 DIAGNOSIS — J0140 Acute pansinusitis, unspecified: Secondary | ICD-10-CM

## 2022-02-20 MED ORDER — METHYLPREDNISOLONE 4 MG PO TBPK: ORAL_TABLET | ORAL | 0 refills | Status: DC

## 2022-02-20 NOTE — Progress Notes (Signed)
Added medrol taper - take as directed for 6 days. Sent to [facility].  ?

## 2022-02-23 DIAGNOSIS — J0140 Acute pansinusitis, unspecified: Secondary | ICD-10-CM | POA: Insufficient documentation

## 2022-03-11 NOTE — Progress Notes (Signed)
? ? ?Ronnell Freshwater, NP ? ? ?Chief Complaint  ?Patient presents with  ? Vaginal Discharge  ?  Abnormal odor, itching x couple of days  ? ? ?HPI: ?     Ms. AHJANAE HUITT is a 36 y.o. [phone] whose LMP was Patient's last menstrual period was 02/11/2022 (exact date)., presents today for increased vag d/c with fishy odor and irritation for a few days. No urin sx. Pt on abx and prednisone taper recently due to sinusitis. Took a diflucan last night due to itching. Pt with hx of recurrent BV, treated with BV. Was seen 3/23 by Roberto Scales CNM and was treated with doxy for ureaplasma in urine. Atopobium found on culture 3/23 but not treated. Pt uses dove sens skin soap or water to wash, uses dryer sheets, no thongs, no synthetic underwear. Not sexually active.  ?Has just started probiotics, has never done boric acid supp.  ? ? ?Patient Active Problem List  ? Diagnosis Date Noted  ? Acute non-recurrent pansinusitis 02/23/2022  ? Smoking trying to quit 01/02/2022  ? H/O iron deficiency 01/02/2022  ? Bilateral carpal tunnel syndrome 11/24/2021  ? Body mass index (BMI) of 39.0-39.9 in adult 11/24/2021  ? Microhematuria 04/17/2021  ? Anemia 02/17/2021  ? Obesity 02/17/2021  ? Tobacco use disorder 08/07/2019  ? Low vitamin D level 07/07/2019  ? Current moderate episode of major depressive disorder without prior episode ([place]) 07/27/2018  ? Generalized anxiety disorder 07/27/2018  ? Bacterial vaginitis 05/27/2018  ? Antibiotic-induced yeast infection 05/27/2018  ? ? ?Past Surgical History:  ?Procedure Laterality Date  ? ADENOIDECTOMY  1986  ? DILATION AND CURETTAGE OF UTERUS  March 10, 2009  ? ECTOPIC PREGNANCY SURGERY  Mar 10, 2005  ? Dr Georga Bora  ? TONSILLECTOMY  2006/03/10  ? ? ?Family History  ?Problem Relation Age of Onset  ? Diabetes Mellitus II Mother   ? COPD Mother   ? Arthritis Mother   ? Stroke Father   ? Hypertension Father   ? Brain cancer Father   ?     meningioma-died March 10, 2013  ? Diabetes Father   ? ? ?Social History   ? ?Socioeconomic History  ? Marital status: Single  ?  Spouse name: Not on file  ? Number of children: Not on file  ? Years of education: Not on file  ? Highest education level: Not on file  ?Occupational History  ? Not on file  ?Tobacco Use  ? Smoking status: Every Day  ?  Types: Cigarettes  ? Smokeless tobacco: Never  ?Vaping Use  ? Vaping Use: Never used  ?Substance and Sexual Activity  ? Alcohol use: Yes  ?  Comment: occasionally  ? Drug use: No  ? Sexual activity: Not Currently  ?  Birth control/protection: Pill  ?Other Topics Concern  ? Not on file  ?Social History Narrative  ? Not on file  ? ?Social Determinants of Health  ? ?Financial Resource Strain: Not on file  ?Food Insecurity: Not on file  ?Transportation Needs: Not on file  ?Physical Activity: Not on file  ?Stress: Not on file  ?Social Connections: Not on file  ?Intimate Partner Violence: Not on file  ? ? ?Outpatient Medications Prior to Visit  ?Medication Sig Dispense Refill  ? buPROPion (WELLBUTRIN) 75 MG tablet Take 1 tablet (75 mg total) by mouth 2 (two) times daily. 60 tablet 2  ? busPIRone (BUSPAR) 10 MG tablet TAKE 1/2 TO 1 TABLET BY MOUTH TWICE DAILY AS NEEDED FOR ANXIETY 180  tablet 1  ? Elastic Bandages & Supports (CARPAL TUNNEL WRIST STABILIZER) MISC Bilateral carpal tunnel splints to wear at night. 2 each 1  ? ergocalciferol (DRISDOL) 1.25 MG (50000 UT) capsule Take 1 capsule (50,000 Units total) by mouth once a week. 4 capsule 5  ? fluconazole (DIFLUCAN) 150 MG tablet Take 1 tablet po once. May repeat dose in 3 days as needed for persistent symptoms. 3 tablet 0  ? meloxicam (MOBIC) 7.5 MG tablet Take 1 tablet (7.5 mg total) by mouth daily. 30 tablet 2  ? nicotine (NICODERM CQ) 14 mg/24hr patch Place 1 patch (14 mg total) onto the skin daily. 30 patch 2  ? norethindrone (MICRONOR) 0.35 MG tablet Take 1 tablet (0.35 mg total) by mouth daily. 84 tablet 3  ? valACYclovir (VALTREX) 500 MG tablet Take 1 tablet (500 mg total) by mouth daily. 30  tablet 11  ? cefUROXime (CEFTIN) 500 MG tablet Take 1 tablet (500 mg total) by mouth 2 (two) times daily with a meal. 20 tablet 0  ? methylPREDNISolone (MEDROL) 4 MG TBPK tablet Take by mouth as directed for 6 days 21 tablet 0  ? ?No facility-administered medications prior to visit.  ? ? ? ? ?ROS: ? ?Review of Systems  ?Constitutional:  Negative for fever.  ?Gastrointestinal:  Negative for blood in stool, constipation, diarrhea, nausea and vomiting.  ?Genitourinary:  Positive for vaginal discharge. Negative for dyspareunia, dysuria, flank pain, frequency, hematuria, urgency, vaginal bleeding and vaginal pain.  ?Musculoskeletal:  Negative for back pain.  ?Skin:  Negative for rash.  ?BREAST: No symptoms ? ? ?OBJECTIVE:  ? ?Vitals:  ?BP 120/80   Ht 5\' 4"  (1.626 m)   Wt 230 lb (104.3 kg)   LMP 02/11/2022 (Exact Date)   BMI 39.48 kg/m?  ? ?Physical Exam ?Vitals reviewed.  ?Constitutional:   ?   Appearance: She is well-developed.  ?Pulmonary:  ?   Effort: Pulmonary effort is normal.  ?Genitourinary: ?   General: Normal vulva.  ?   Pubic Area: No rash.   ?   Labia:     ?   Right: No rash, tenderness or lesion.     ?   Left: No rash, tenderness or lesion.   ?   Vagina: Vaginal discharge present. No erythema or tenderness.  ?   Cervix: Normal.  ?   Uterus: Normal. Not enlarged and not tender.   ?   Adnexa: Right adnexa normal and left adnexa normal.    ?   Right: No mass or tenderness.      ?   Left: No mass or tenderness.    ?Musculoskeletal:     ?   General: Normal range of motion.  ?   Cervical back: Normal range of motion.  ?Skin: ?   General: Skin is warm and dry.  ?Neurological:  ?   General: No focal deficit present.  ?   Mental Status: She is alert and oriented to person, place, and time.  ?Psychiatric:     ?   Mood and Affect: Mood normal.     ?   Behavior: Behavior normal.     ?   Thought Content: Thought content normal.     ?   Judgment: Judgment normal.  ? ? ?Results: ?Results for orders placed or performed  in visit on 03/12/22 (from the past 24 hour(s))  ?POCT Wet Prep with KOH     Status: Abnormal  ? Collection Time: 03/12/22 11:33 AM  ?Result Value  Ref Range  ? Trichomonas, UA Negative   ? Clue Cells Wet Prep HPF POC pos   ? Epithelial Wet Prep HPF POC    ? Yeast Wet Prep HPF POC neg   ? Bacteria Wet Prep HPF POC    ? RBC Wet Prep HPF POC    ? WBC Wet Prep HPF POC    ? KOH Prep POC Positive (A) Negative  ? ? ? ?Assessment/Plan: ?BV (bacterial vaginosis) - Plan: clindamycin (CLEOCIN) 300 MG capsule, fluconazole (DIFLUCAN) 150 MG tablet, POCT Wet Prep with KOH; pos sx and wet prep. Treat with clindamycin, Rx eRxd. Add boric acid supp QHS for 2-3 months, cont probiotics. Line dry underwear. F/u prn. Rx RF diflucan prn yeast vag sx with clindamycin.  ? ? ?Meds ordered this encounter  ?Medications  ? clindamycin (CLEOCIN) 300 MG capsule  ?  Sig: Take 1 capsule (300 mg total) by mouth 2 (two) times daily for 7 days.  ?  Dispense:  14 capsule  ?  Refill:  0  ?  Order Specific Question:   Supervising Provider  ?  AnswerGae Dry [id number]  ? fluconazole (DIFLUCAN) 150 MG tablet  ?  Sig: Take 1 tablet (150 mg total) by mouth once for 1 dose.  ?  Dispense:  1 tablet  ?  Refill:  0  ?  Order Specific Question:   Supervising Provider  ?  AnswerGae Dry [id number]  ? ? ? ? Return if symptoms worsen or fail to improve. ? ?Shatyra Becka B. Charmain Diosdado, PA-C ?03/12/2022 ?11:34 AM ? ? ? ? ? ?

## 2022-03-12 ENCOUNTER — Ambulatory Visit (INDEPENDENT_AMBULATORY_CARE_PROVIDER_SITE_OTHER): Payer: BLUE CROSS/BLUE SHIELD | Admitting: Obstetrics and Gynecology

## 2022-03-12 ENCOUNTER — Encounter: Payer: Self-pay | Admitting: Obstetrics and Gynecology

## 2022-03-12 VITALS — BP 120/80 | Ht 64.0 in | Wt 230.0 lb

## 2022-03-12 DIAGNOSIS — N760 Acute vaginitis: Secondary | ICD-10-CM

## 2022-03-12 DIAGNOSIS — B9689 Other specified bacterial agents as the cause of diseases classified elsewhere: Secondary | ICD-10-CM | POA: Diagnosis not present

## 2022-03-12 LAB — POCT WET PREP WITH KOH
Clue Cells Wet Prep HPF POC: POSITIVE
KOH Prep POC: POSITIVE — AB
Trichomonas, UA: NEGATIVE
Yeast Wet Prep HPF POC: NEGATIVE

## 2022-03-12 MED ORDER — FLUCONAZOLE 150 MG PO TABS: 150.0000 mg | ORAL_TABLET | Freq: Once | ORAL | 0 refills | Status: AC

## 2022-03-12 MED ORDER — CLINDAMYCIN HCL 300 MG PO CAPS: 300.0000 mg | ORAL_CAPSULE | Freq: Two times a day (BID) | ORAL | 0 refills | Status: DC

## 2022-03-27 ENCOUNTER — Other Ambulatory Visit: Payer: Self-pay | Admitting: Advanced Practice Midwife

## 2022-03-27 DIAGNOSIS — B009 Herpesviral infection, unspecified: Secondary | ICD-10-CM

## 2022-04-02 ENCOUNTER — Encounter: Payer: Self-pay | Admitting: Obstetrics and Gynecology

## 2022-04-05 ENCOUNTER — Other Ambulatory Visit: Payer: Self-pay | Admitting: Obstetrics and Gynecology

## 2022-04-05 MED ORDER — METRONIDAZOLE 0.75 % EX GEL: 1.0000 "application " | Freq: Every day | CUTANEOUS | 0 refills | Status: DC

## 2022-04-05 NOTE — Progress Notes (Signed)
Rx RF metrogel for BV sx. 

## 2022-04-08 ENCOUNTER — Other Ambulatory Visit: Payer: Self-pay | Admitting: Obstetrics and Gynecology

## 2022-04-08 MED ORDER — METRONIDAZOLE 0.75 % VA GEL: 1.0000 | Freq: Every day | VAGINAL | 0 refills | Status: AC

## 2022-04-23 IMAGING — US US PELVIS COMPLETE WITH TRANSVAGINAL
1 series · 13 of 25 positions shown · non-contrast
Comparison: 08/04/2019

CLINICAL DATA: Pelvic pain for 2-3 years, LMP 02/11/2022

EXAM:
TRANSABDOMINAL AND TRANSVAGINAL ULTRASOUND OF PELVIS
TECHNIQUE: Both transabdominal and transvaginal ultrasound examinations of the
pelvis were performed. Transabdominal technique was performed for
global imaging of the pelvis including uterus, ovaries, adnexal
regions, and pelvic cul-de-sac. It was necessary to proceed with
endovaginal exam following the transabdominal exam to visualize the
endometrium and ovaries.

[Series 1: us pelvic complete with transvaginal · 13 of 109 slices shown]
[im 1/109]
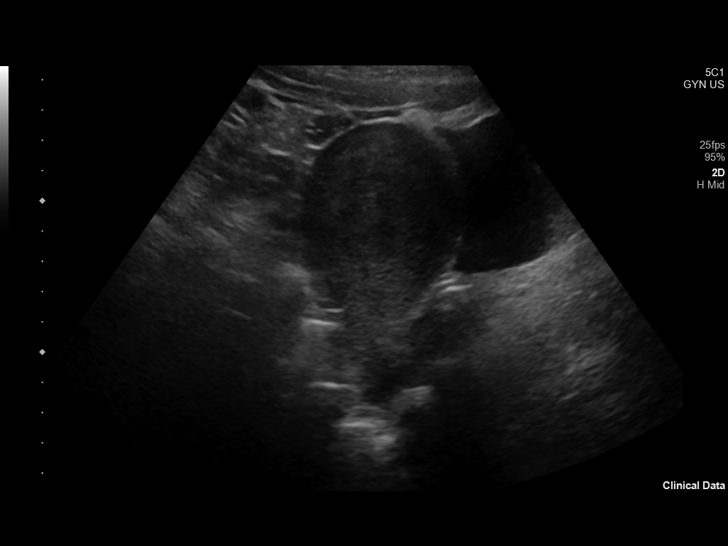
[im 10/109]
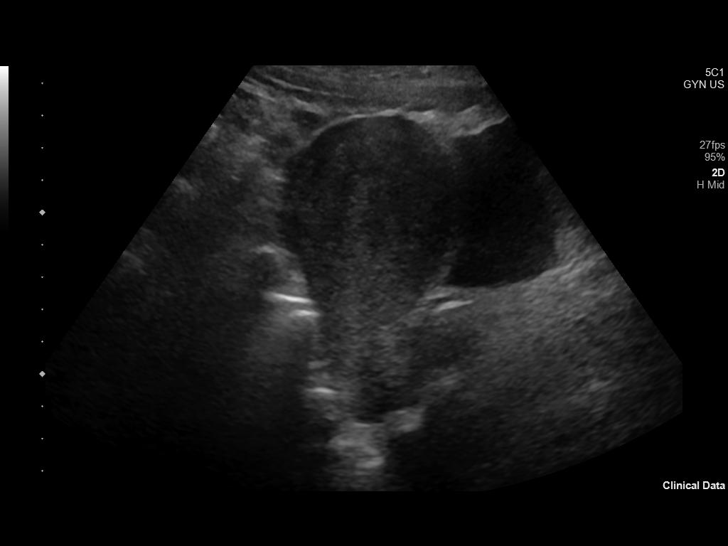
[im 19/109]
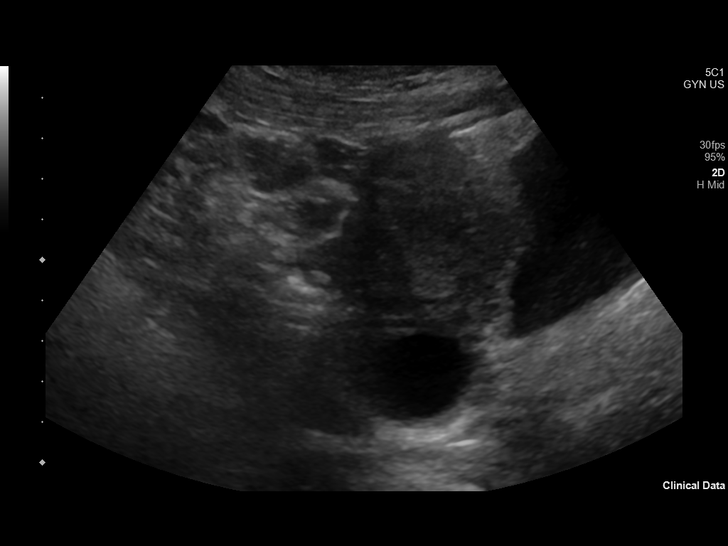
[im 28/109]
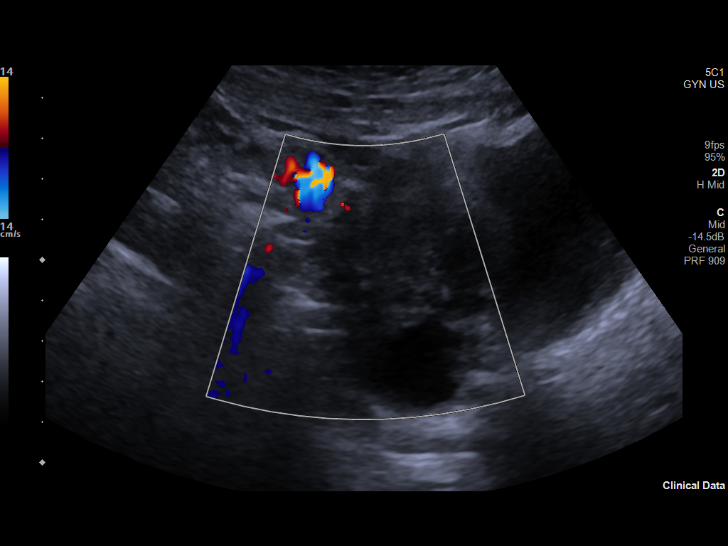
[im 37/109]
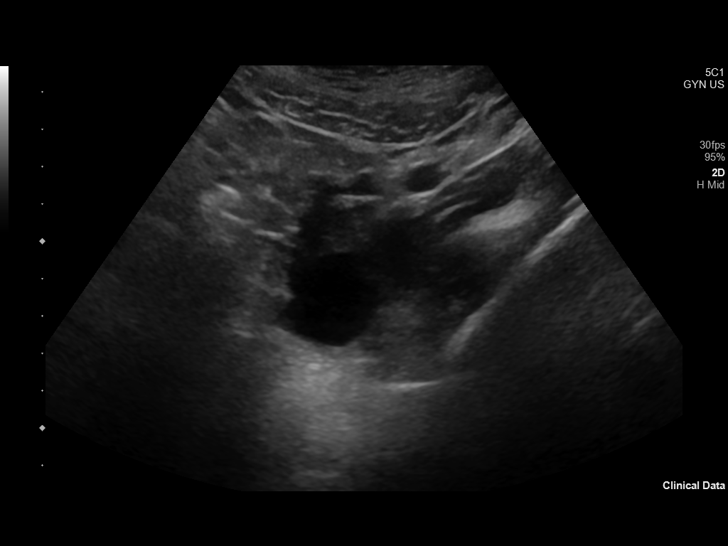
[im 46/109]
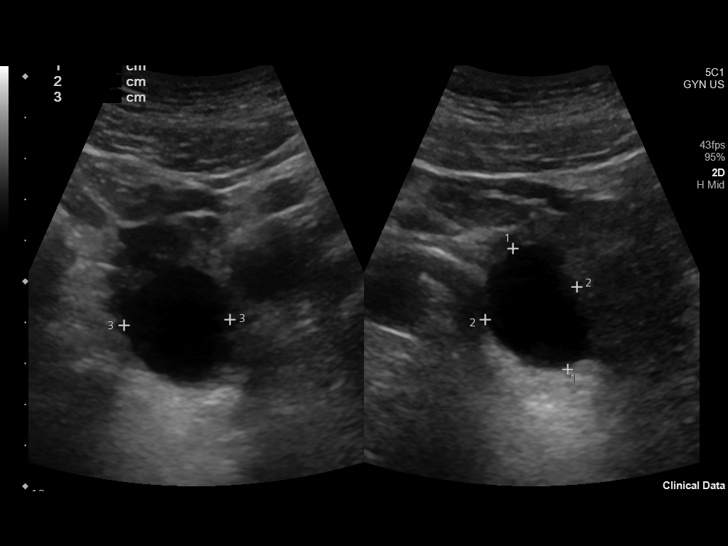
[im 55/109]
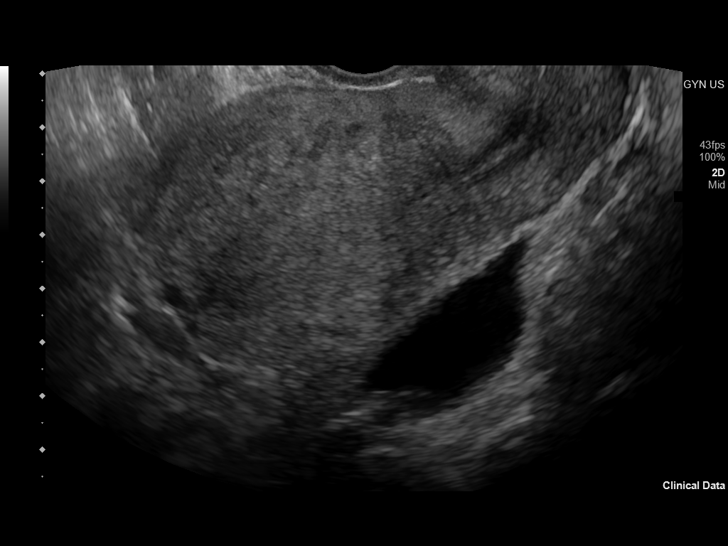
[im 64/109]
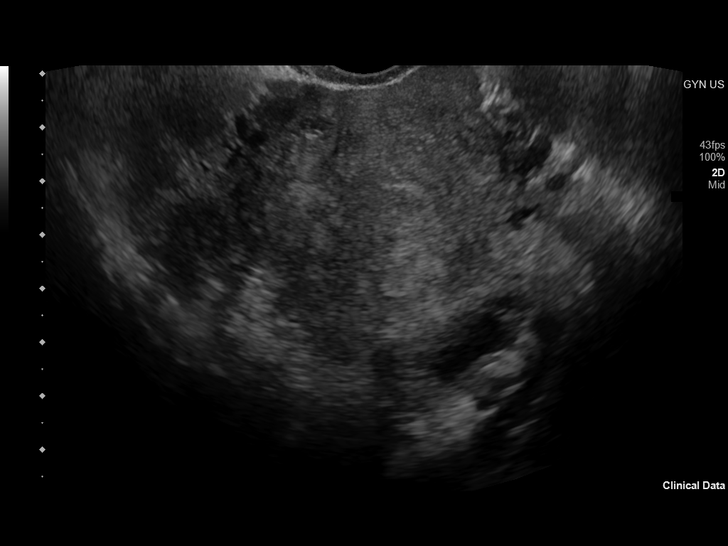
[im 73/109]
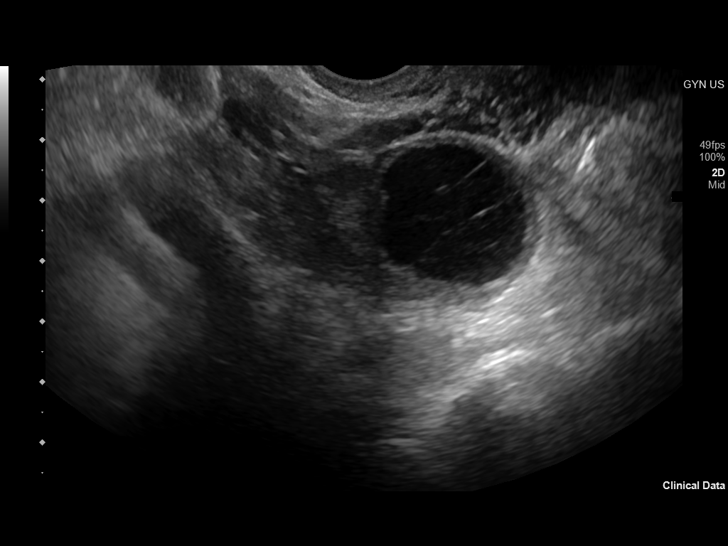
[im 82/109]
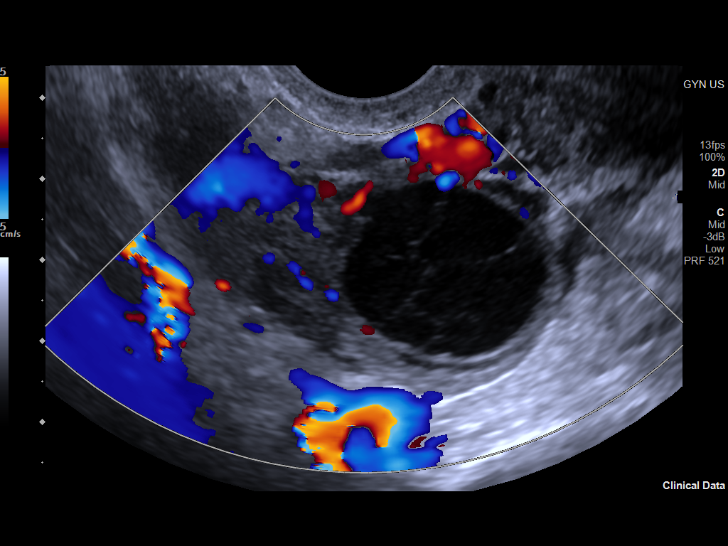
[im 91/109]
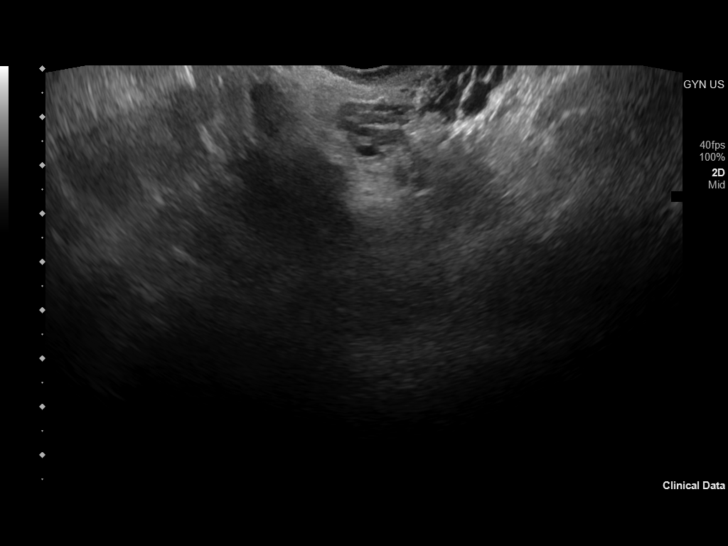
[im 100/109]
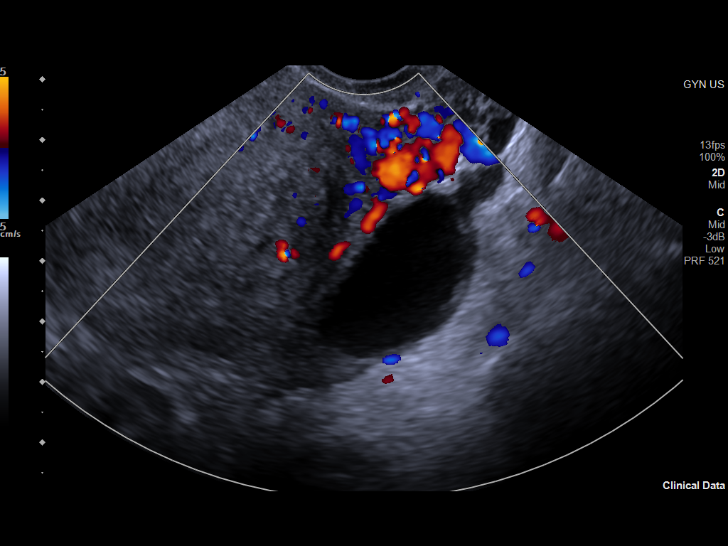
[im 109/109]
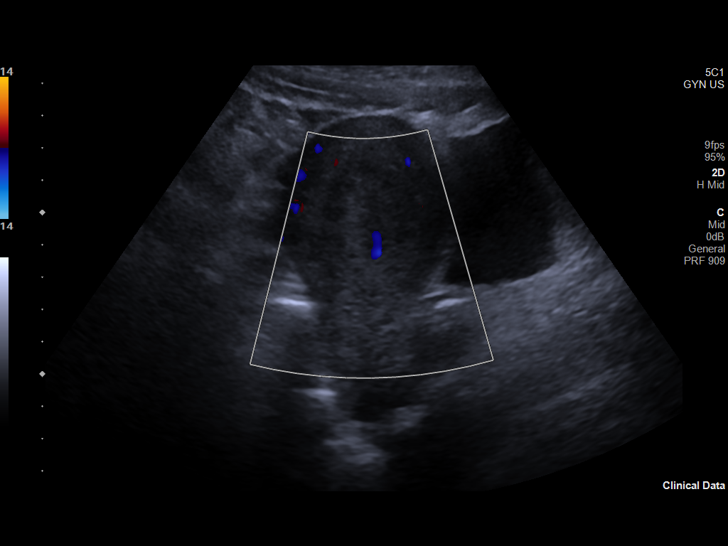

[13 of 25 positions shown; findings below may reference images not displayed]

FINDINGS: Uterus

Measurements: 9.8 x 5.4 x 6.4 cm = volume: 176 mL. Anteverted.
Normal morphology without mass

Endometrium

Thickness: 9 mm.  No endometrial fluid or mass

Right ovary

Measurements: 4.3 x 2.6 x 3.3 cm = volume: 19 mL. Hemorrhagic cyst
within RIGHT ovary 2.7 x 2.5 x 3.0 cm; no follow-up imaging
recommended

Left ovary

Measurements: 4.7 x 2.1 x 3.0 cm = volume: 15 mL. Simple cyst within
LEFT ovary 3.4 x 1.8 x 2.5 cm; No follow up imaging recommended.
Note: This recommendation does not apply to premenarchal patients or
to those with increased risk (genetic, family history, elevated
tumor markers or other high-risk factors) of ovarian cancer.
Reference: Radiology [DATE]):[id number].

Other findings

No free pelvic fluid.  No adnexal masses.
IMPRESSION: Small hemorrhagic cyst RIGHT ovary and simple cyst LEFT ovary; no
follow-up imaging recommended, as above.

Remainder of exam unremarkable.

## 2022-07-22 ENCOUNTER — Encounter: Payer: Self-pay | Admitting: Nurse Practitioner

## 2022-07-23 ENCOUNTER — Ambulatory Visit (INDEPENDENT_AMBULATORY_CARE_PROVIDER_SITE_OTHER): Payer: BLUE CROSS/BLUE SHIELD

## 2022-07-23 VITALS — Wt 239.6 lb

## 2022-07-23 DIAGNOSIS — N898 Other specified noninflammatory disorders of vagina: Secondary | ICD-10-CM

## 2022-07-23 DIAGNOSIS — R350 Frequency of micturition: Secondary | ICD-10-CM

## 2022-07-23 LAB — POCT URINALYSIS DIPSTICK
Bilirubin, UA: NEGATIVE
Glucose, UA: NEGATIVE
Ketones, UA: NEGATIVE
Leukocytes, UA: NEGATIVE
Nitrite, UA: NEGATIVE
Protein, UA: POSITIVE — AB
Spec Grav, UA: >=1.03 — AB (ref 1.010–1.025)
Urobilinogen, UA: 0.2 E.U./dL
pH, UA: 6 (ref 5.0–8.0)

## 2022-07-23 NOTE — Progress Notes (Signed)
Pls include pt's sx with this note so we'll know when results come back. Thx.

## 2022-07-23 NOTE — Patient Instructions (Signed)

## 2022-07-28 ENCOUNTER — Other Ambulatory Visit (HOSPITAL_COMMUNITY)
Admission: RE | Admit: 2022-07-28 | Discharge: 2022-07-28 | Disposition: A | Discharge status: Home / Self Care | Payer: BLUE CROSS/BLUE SHIELD | Source: Ambulatory Visit | Attending: Obstetrics and Gynecology | Admitting: Obstetrics and Gynecology

## 2022-07-28 ENCOUNTER — Encounter: Payer: Self-pay | Admitting: Obstetrics and Gynecology

## 2022-07-28 ENCOUNTER — Ambulatory Visit (INDEPENDENT_AMBULATORY_CARE_PROVIDER_SITE_OTHER): Payer: BLUE CROSS/BLUE SHIELD | Admitting: Obstetrics and Gynecology

## 2022-07-28 VITALS — BP 110/80 | Ht 64.0 in | Wt 239.0 lb

## 2022-07-28 DIAGNOSIS — B9689 Other specified bacterial agents as the cause of diseases classified elsewhere: Secondary | ICD-10-CM

## 2022-07-28 DIAGNOSIS — N760 Acute vaginitis: Secondary | ICD-10-CM | POA: Diagnosis not present

## 2022-07-28 DIAGNOSIS — N3001 Acute cystitis with hematuria: Secondary | ICD-10-CM | POA: Diagnosis not present

## 2022-07-28 DIAGNOSIS — Z113 Encounter for screening for infections with a predominantly sexual mode of transmission: Secondary | ICD-10-CM

## 2022-07-28 LAB — POCT URINALYSIS DIPSTICK
Bilirubin, UA: NEGATIVE
Glucose, UA: NEGATIVE
Ketones, UA: NEGATIVE
Leukocytes, UA: NEGATIVE
Nitrite, UA: NEGATIVE
Protein, UA: NEGATIVE
Spec Grav, UA: 1.025 (ref 1.010–1.025)
pH, UA: 6 (ref 5.0–8.0)

## 2022-07-28 LAB — POCT WET PREP WITH KOH
Clue Cells Wet Prep HPF POC: POSITIVE
KOH Prep POC: POSITIVE — AB
Trichomonas, UA: NEGATIVE
Yeast Wet Prep HPF POC: NEGATIVE

## 2022-07-28 MED ORDER — NITROFURANTOIN MONOHYD MACRO 100 MG PO CAPS: 100.0000 mg | ORAL_CAPSULE | Freq: Two times a day (BID) | ORAL | 0 refills | Status: AC

## 2022-07-28 MED ORDER — METRONIDAZOLE 500 MG PO TABS: ORAL_TABLET | ORAL | 0 refills | Status: DC

## 2022-07-28 MED ORDER — FLUCONAZOLE 150 MG PO TABS: 150.0000 mg | ORAL_TABLET | Freq: Once | ORAL | 0 refills | Status: AC

## 2022-07-28 NOTE — Progress Notes (Signed)
Marilyn Jews, NP   Chief Complaint  Patient presents with   Vaginal Discharge    Sour odor, itching, irritation x 1 week   Pelvic Pain    Entire area x 1 week   Urinary Tract Infection    Frequency x 1 week    HPI:      [name] is a 36 y.o. [id number] whose LMP was Patient's last menstrual period was 07/12/2022 (exact date)., presents today for UTI sx of frequency with and without good flow, urgency, LT LBP, pelvic pain (intermittent, throbbing) for the past wk. No hematuria, dysuria. Hx of neg C&S in past. Pt with blood on UA 07/23/22, C&S pending. No vag bleeding. Pt also with increased vag d/c with odor, mild irritation for about a wk. Hx of recurrent BV. Using boric acid supp without relief. No sex activity for over a month. Using condoms. Did start using dryer sheets recently.    Patient Active Problem List   Diagnosis Date Noted   Acute non-recurrent pansinusitis 02/23/2022   Smoking trying to quit 01/02/2022   H/O iron deficiency 01/02/2022   Bilateral carpal tunnel syndrome 11/24/2021   Body mass index (BMI) of 39.0-39.9 in adult 11/24/2021   Microhematuria 04/17/2021   Anemia 02/17/2021   Obesity 02/17/2021   Tobacco use disorder 08/07/2019   Low vitamin D level 07/07/2019   Current moderate episode of major depressive disorder without prior episode ([facility]) 07/27/2018   Generalized anxiety disorder 07/27/2018   Bacterial vaginitis 05/27/2018   Antibiotic-induced yeast infection 05/27/2018    Past Surgical History:  Procedure Laterality Date   ADENOIDECTOMY  1986   DILATION AND CURETTAGE OF UTERUS  2010   ECTOPIC PREGNANCY SURGERY  04-06-05   Dr Toya Smothers   TONSILLECTOMY  04-06-06    Family History  Problem Relation Age of Onset   Diabetes Mellitus II Mother    COPD Mother    Arthritis Mother    Stroke Father    Hypertension Father    Brain cancer Father        meningioma-died 2013/04/06  Diabetes Father     Social History    Socioeconomic History   Marital status: Single    Spouse name: Not on file   Number of children: Not on file   Years of education: Not on file   Highest education level: Not on file  Occupational History   Not on file  Tobacco Use   Smoking status: Every Day    Types: Cigarettes   Smokeless tobacco: Never  Vaping Use   Vaping Use: Never used  Substance and Sexual Activity   Alcohol use: Yes    Comment: occasionally   Drug use: No   Sexual activity: Not Currently    Birth control/protection: Pill  Other Topics Concern   Not on file  Social History Narrative   Not on file   Social Determinants of Health   Financial Resource Strain: Not on file  Food Insecurity: Not on file  Transportation Needs: Not on file  Physical Activity: Not on file  Stress: Not on file  Social Connections: Not on file  Intimate Partner Violence: Not on file    Outpatient Medications Prior to Visit  Medication Sig Dispense Refill   busPIRone (BUSPAR) 10 MG tablet TAKE 1/2 TO 1 TABLET BY MOUTH TWICE DAILY AS NEEDED FOR ANXIETY 180 tablet 1   meloxicam (MOBIC) 7.5 MG tablet Take 1 tablet (7.5 mg total) by mouth  daily. 30 tablet 2   norethindrone (MICRONOR) 0.35 MG tablet Take 1 tablet (0.35 mg total) by mouth daily. 84 tablet 3   valACYclovir (VALTREX) 500 MG tablet TAKE 1 TABLET (500 MG TOTAL) BY MOUTH DAILY. 90 tablet 2   ergocalciferol (DRISDOL) 1.25 MG (50000 UT) capsule Take 1 capsule (50,000 Units total) by mouth once a week. (Patient not taking: Reported on 07/28/2022) 4 capsule 5   buPROPion (WELLBUTRIN) 75 MG tablet Take 1 tablet (75 mg total) by mouth 2 (two) times daily. 60 tablet 2   Elastic Bandages & Supports (CARPAL TUNNEL WRIST STABILIZER) MISC Bilateral carpal tunnel splints to wear at night. 2 each 1   fluconazole (DIFLUCAN) 150 MG tablet Take 1 tablet po once. May repeat dose in 3 days as needed for persistent symptoms. (Patient not taking: Reported on 07/23/2022) 3 tablet 0    nicotine (NICODERM CQ) 14 mg/24hr patch Place 1 patch (14 mg total) onto the skin daily. 30 patch 2   No facility-administered medications prior to visit.      ROS:  Review of Systems  Constitutional:  Negative for fever.  Gastrointestinal:  Negative for blood in stool, constipation, diarrhea, nausea and vomiting.  Genitourinary:  Positive for frequency, pelvic pain, urgency and vaginal discharge. Negative for dyspareunia, dysuria, flank pain, hematuria, vaginal bleeding and vaginal pain.  Musculoskeletal:  Positive for back pain.  Skin:  Negative for rash.   BREAST: No symptoms   OBJECTIVE:   Vitals:  BP 110/80   Ht 5\' 4"  (1.626 m)   Wt 239 lb (108.4 kg)   LMP 07/12/2022 (Exact Date)   BMI 41.02 kg/m   Physical Exam Vitals reviewed.  Constitutional:      Appearance: She is well-developed. She is not ill-appearing or toxic-appearing.  Pulmonary:     Effort: Pulmonary effort is normal.  Abdominal:     Tenderness: There is no right CVA tenderness or left CVA tenderness.  Genitourinary:    General: Normal vulva.     Pubic Area: No rash.      Labia:        Right: No rash, tenderness or lesion.        Left: No rash, tenderness or lesion.      Vagina: Normal. No vaginal discharge, erythema, tenderness or bleeding.     Cervix: Normal.     Uterus: Normal. Not enlarged and not tender.      Adnexa: Right adnexa normal and left adnexa normal.       Right: No mass or tenderness.         Left: No mass or tenderness.    Musculoskeletal:        General: Normal range of motion.     Cervical back: Normal range of motion.  Skin:    General: Skin is warm and dry.  Neurological:     General: No focal deficit present.     Mental Status: She is alert and oriented to person, place, and time.     Cranial Nerves: No cranial nerve deficit.  Psychiatric:        Mood and Affect: Mood normal.        Behavior: Behavior normal.        Thought Content: Thought content normal.         Judgment: Judgment normal.     Results: Results for orders placed or performed in visit on 07/28/22 (from the past 24 hour(s))  POCT Urinalysis Dipstick     Status: Abnormal  Collection Time: 07/28/22  4:44 PM  Result Value Ref Range   Color, UA yellow    Clarity, UA clear    Glucose, UA Negative Negative   Bilirubin, UA neg    Ketones, UA neg    Spec Grav, UA 1.025 1.010 - 1.025   Blood, UA large    pH, UA 6.0 5.0 - 8.0   Protein, UA Negative Negative   Urobilinogen, UA     Nitrite, UA neg    Leukocytes, UA Negative Negative   Appearance     Odor    POCT Wet Prep with KOH     Status: Abnormal   Collection Time: 07/28/22  4:45 PM  Result Value Ref Range   Trichomonas, UA Negative    Clue Cells Wet Prep HPF POC pos    Epithelial Wet Prep HPF POC     Yeast Wet Prep HPF POC neg    Bacteria Wet Prep HPF POC     RBC Wet Prep HPF POC     WBC Wet Prep HPF POC     KOH Prep POC Positive (A) Negative     Assessment/Plan: Acute cystitis with hematuria - Plan: nitrofurantoin, macrocrystal-monohydrate, (MACROBID) 100 MG capsule, POCT Urinalysis Dipstick; pos sx and UA. Rx macrobid. Awaiting C&S results. If neg, need to eval hematuria  BV (bacterial vaginosis) - Plan: metroNIDAZOLE (FLAGYL) 500 MG tablet, fluconazole (DIFLUCAN) 150 MG tablet, POCT Wet Prep with KOH; pos sx and wet prep. Rx flagyl, no EtOH. Line dry underwear  Screening for STD (sexually transmitted disease) - Plan: Cervicovaginal ancillary only    Meds ordered this encounter  Medications   metroNIDAZOLE (FLAGYL) 500 MG tablet    Sig: Take 1 tab BID for 7 days; NO alcohol use for 10 days after prescription start    Dispense:  14 tablet    Refill:  0    Order Specific Question:   Supervising Provider    Answer:   Hildred Laser [[id number]]   fluconazole (DIFLUCAN) 150 MG tablet    Sig: Take 1 tablet (150 mg total) by mouth once for 1 dose.    Dispense:  1 tablet    Refill:  0    Order Specific Question:    Supervising Provider    Answer:   Hildred Laser [[id number]]   nitrofurantoin, macrocrystal-monohydrate, (MACROBID) 100 MG capsule    Sig: Take 1 capsule (100 mg total) by mouth 2 (two) times daily for 5 days.    Dispense:  10 capsule    Refill:  0    Order Specific Question:   Supervising Provider    Answer:   Hildred Laser [[id number]]      Return if symptoms worsen or fail to improve.  Ericberto Padget B. Vincentina Sollers, PA-C 07/28/2022 4:47 PM

## 2022-07-29 LAB — URINE CULTURE

## 2022-07-30 LAB — CERVICOVAGINAL ANCILLARY ONLY
Chlamydia: NEGATIVE
Comment: NEGATIVE
Comment: NEGATIVE
Comment: NORMAL
Neisseria Gonorrhea: NEGATIVE
Trichomonas: NEGATIVE

## 2022-08-11 ENCOUNTER — Encounter: Payer: Self-pay | Admitting: Obstetrics and Gynecology

## 2022-08-13 ENCOUNTER — Encounter: Payer: Self-pay | Admitting: Obstetrics and Gynecology

## 2022-08-15 ENCOUNTER — Other Ambulatory Visit: Payer: Self-pay | Admitting: Obstetrics and Gynecology

## 2022-08-15 DIAGNOSIS — B9689 Other specified bacterial agents as the cause of diseases classified elsewhere: Secondary | ICD-10-CM

## 2022-08-15 MED ORDER — CLINDAMYCIN HCL 300 MG PO CAPS: 300.0000 mg | ORAL_CAPSULE | Freq: Two times a day (BID) | ORAL | 0 refills | Status: DC

## 2022-08-15 NOTE — Progress Notes (Signed)
Rx clindamycin for BV sx, not improved with flagyl

## 2022-09-24 ENCOUNTER — Ambulatory Visit (INDEPENDENT_AMBULATORY_CARE_PROVIDER_SITE_OTHER): Payer: BLUE CROSS/BLUE SHIELD | Admitting: Obstetrics and Gynecology

## 2022-09-24 ENCOUNTER — Encounter: Payer: Self-pay | Admitting: Obstetrics and Gynecology

## 2022-09-24 ENCOUNTER — Other Ambulatory Visit (HOSPITAL_COMMUNITY)
Admission: RE | Admit: 2022-09-24 | Discharge: 2022-09-24 | Disposition: A | Discharge status: Home / Self Care | Payer: BLUE CROSS/BLUE SHIELD | Source: Ambulatory Visit | Attending: Obstetrics and Gynecology | Admitting: Obstetrics and Gynecology

## 2022-09-24 VITALS — BP 120/90 | Ht 64.0 in | Wt 240.0 lb

## 2022-09-24 DIAGNOSIS — N898 Other specified noninflammatory disorders of vagina: Secondary | ICD-10-CM | POA: Insufficient documentation

## 2022-09-24 DIAGNOSIS — Z113 Encounter for screening for infections with a predominantly sexual mode of transmission: Secondary | ICD-10-CM | POA: Diagnosis not present

## 2022-09-24 DIAGNOSIS — N3001 Acute cystitis with hematuria: Secondary | ICD-10-CM | POA: Diagnosis not present

## 2022-09-24 DIAGNOSIS — J029 Acute pharyngitis, unspecified: Secondary | ICD-10-CM

## 2022-09-24 LAB — POCT WET PREP WITH KOH
Clue Cells Wet Prep HPF POC: NEGATIVE
KOH Prep POC: NEGATIVE
Trichomonas, UA: NEGATIVE
Yeast Wet Prep HPF POC: NEGATIVE

## 2022-09-24 LAB — POCT URINALYSIS DIPSTICK
Bilirubin, UA: NEGATIVE
Glucose, UA: NEGATIVE
Ketones, UA: NEGATIVE
Leukocytes, UA: NEGATIVE
Nitrite, UA: NEGATIVE
Protein, UA: NEGATIVE
Spec Grav, UA: 1.025 (ref 1.010–1.025)
pH, UA: 5 (ref 5.0–8.0)

## 2022-09-24 MED ORDER — NITROFURANTOIN MONOHYD MACRO 100 MG PO CAPS: 100.0000 mg | ORAL_CAPSULE | Freq: Two times a day (BID) | ORAL | 0 refills | Status: DC

## 2022-09-24 NOTE — Progress Notes (Signed)
[name], NP   Chief Complaint  Patient presents with   Vaginal Discharge    Sour odor, some itching   Urinary Tract Infection    Urgency, no burning x 3-4 days, left side back pain    HPI:      Marilyn Li is a 36 y.o. [id number] whose LMP was Patient's last menstrual period was 08/18/2022 (exact date)., presents today for UTI sx of urgency, frequency with little flow, LT LBP, slight pelvic pain for several days, had chills last night. No hematuria/fevers. No vag bleeding. Hx of E Coli and Entero faecalis 8/23 C&S, both sensitive to macrobid.  Also with increased vag d/c with itching, no fishy odor. Pt had new sexual partner last wk and he took condom off. Pt wants STD testing.  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 Neg STD testing 8/23. Pt also with sore throat with "white spots", no fevers, no nasal congestion/rhinorrhea/cough for a few days. She did perform oral sex on new partner and concerned about STD.   Patient Active Problem List   Diagnosis Date Noted   Acute non-recurrent pansinusitis 02/23/2022   Smoking trying to quit 01/02/2022   H/O iron deficiency 01/02/2022   Bilateral carpal tunnel syndrome 11/24/2021   Body mass index (BMI) of 39.0-39.9 in adult 11/24/2021   Microhematuria 04/17/2021   Anemia 02/17/2021   Obesity 02/17/2021   Tobacco use disorder 08/07/2019   Low vitamin D level 07/07/2019   Current moderate episode of major depressive disorder without prior episode (HCC) 07/27/2018   Generalized anxiety disorder 07/27/2018   Bacterial vaginitis 05/27/2018   Antibiotic-induced yeast infection 05/27/2018    Past Surgical History:  Procedure Laterality Date   ADENOIDECTOMY  1986   DILATION AND CURETTAGE OF UTERUS  2010   ECTOPIC PREGNANCY SURGERY  03/26/05   Dr Toya Smothers   TONSILLECTOMY  03/26/2006    Family  History  Problem Relation Age of Onset   Diabetes Mellitus II Mother    COPD Mother    Arthritis Mother    Stroke Father    Hypertension Father    Brain cancer Father        meningioma-died Mar 26, 2013  Diabetes Father     Social History   Socioeconomic History   Marital status: Single    Spouse name: Not on file   Number of children: Not on file   Years of education: Not on file   Highest education level: Not on file  Occupational History   Not on file  Tobacco Use   Smoking status: Every Day    Types: Cigarettes   Smokeless tobacco: Never  Vaping Use   Vaping Use: Never used  Substance and Sexual Activity   Alcohol use: Yes    Comment: occasionally   Drug use: No   Sexual activity: Not Currently    Birth control/protection: Pill  Other Topics Concern   Not on file  Social History Narrative   Not on file   Social Determinants of Health   Financial Resource Strain: Not on file  Food Insecurity: Not on file  Transportation Needs: Not on file  Physical Activity: Not on file  Stress: Not on file  Social Connections: Not on file  Intimate Partner Violence: Not on file    Outpatient Medications Prior to Visit  Medication Sig Dispense Refill  busPIRone (BUSPAR) 10 MG tablet TAKE 1/2 TO 1 TABLET BY MOUTH TWICE DAILY AS NEEDED FOR ANXIETY 180 tablet 1   ergocalciferol (DRISDOL) 1.25 MG (50000 UT) capsule Take 1 capsule (50,000 Units total) by mouth once a week. 4 capsule 5   meloxicam (MOBIC) 7.5 MG tablet Take 1 tablet (7.5 mg total) by mouth daily. 30 tablet 2   norethindrone (MICRONOR) 0.35 MG tablet Take 1 tablet (0.35 mg total) by mouth daily. 84 tablet 3   valACYclovir (VALTREX) 500 MG tablet TAKE 1 TABLET (500 MG TOTAL) BY MOUTH DAILY. 90 tablet 2   metroNIDAZOLE (FLAGYL) 500 MG tablet Take 1 tab BID for 7 days; NO alcohol use for 10 days after prescription start 14 tablet 0   No facility-administered medications prior to visit.     ROS:  Review of  Systems  Constitutional:  Positive for chills. Negative for fever.  HENT:  Positive for sore throat.   Gastrointestinal:  Negative for blood in stool, constipation, diarrhea, nausea and vomiting.  Genitourinary:  Positive for frequency, urgency and vaginal discharge. Negative for dyspareunia, dysuria, flank pain, hematuria, vaginal bleeding and vaginal pain.  Musculoskeletal:  Positive for back pain.  Skin:  Negative for rash.   BREAST: No symptoms   OBJECTIVE:   Vitals:  BP (!) 120/90   Ht 5\' 4"  (1.626 m)   Wt 240 lb (108.9 kg)   LMP 08/18/2022 (Exact Date)   BMI 41.20 kg/m   Physical Exam Vitals reviewed.  Constitutional:      Appearance: She is well-developed. She is not ill-appearing or toxic-appearing.  HENT:     Mouth/Throat:     Mouth: Mucous membranes are moist.     Palate: No lesions.     Tonsils: No tonsillar exudate or tonsillar abscesses.  Pulmonary:     Effort: Pulmonary effort is normal.  Abdominal:     Tenderness: There is no right CVA tenderness or left CVA tenderness.  Genitourinary:    General: Normal vulva.     Pubic Area: No rash.      Labia:        Right: No rash, tenderness or lesion.        Left: No rash, tenderness or lesion.      Vagina: Normal. No vaginal discharge, erythema, tenderness or bleeding.     Cervix: Normal.     Uterus: Normal. Not enlarged and not tender.      Adnexa: Right adnexa normal and left adnexa normal.       Right: No mass or tenderness.         Left: No mass or tenderness.    Musculoskeletal:        General: Normal range of motion.     Cervical back: Normal range of motion.  Lymphadenopathy:     Cervical: No cervical adenopathy.  Skin:    General: Skin is warm and dry.  Neurological:     General: No focal deficit present.     Mental Status: She is alert and oriented to person, place, and time.     Cranial Nerves: No cranial nerve deficit.  Psychiatric:        Mood and Affect: Mood normal.        Behavior:  Behavior normal.        Thought Content: Thought content normal.        Judgment: Judgment normal.     Results: Results for orders placed or performed in visit on 09/24/22 (from the past  24 hour(s))  POCT Urinalysis Dipstick     Status: Abnormal   Collection Time: 09/24/22 12:53 PM  Result Value Ref Range   Color, UA yellow    Clarity, UA clear    Glucose, UA Negative Negative   Bilirubin, UA neg    Ketones, UA neg    Spec Grav, UA 1.025 1.010 - 1.025   Blood, UA large    pH, UA 5.0 5.0 - 8.0   Protein, UA Negative Negative   Urobilinogen, UA     Nitrite, UA neg    Leukocytes, UA Negative Negative   Appearance     Odor    POCT Wet Prep with KOH     Status: Normal   Collection Time: 09/24/22 12:55 PM  Result Value Ref Range   Trichomonas, UA Negative    Clue Cells Wet Prep HPF POC neg    Epithelial Wet Prep HPF POC     Yeast Wet Prep HPF POC neg    Bacteria Wet Prep HPF POC     RBC Wet Prep HPF POC     WBC Wet Prep HPF POC     KOH Prep POC Negative Negative     Assessment/Plan: Acute cystitis with hematuria - Plan: POCT Urinalysis Dipstick, Urine Culture, nitrofurantoin, macrocrystal-monohydrate, (MACROBID) 100 MG capsule; pos sx and UA. Rx macrobid, check C&S.   Vaginal discharge - Plan: POCT Wet Prep with KOH, Cervicovaginal ancillary only; neg wet prep. Rule out STDs/yeast. Will f/u if abn.   Sore throat - Plan: Upper Respiratory Culture, Routine; check STD to rule out gonorrhea. Warm salt water gargles/NSAIDs in meantime. If worsen and STD neg, can f/u with PCP.   Screening for STD (sexually transmitted disease) - Plan: Upper Respiratory Culture, Routine, Cervicovaginal ancillary only    Meds ordered this encounter  Medications   nitrofurantoin, macrocrystal-monohydrate, (MACROBID) 100 MG capsule    Sig: Take 1 capsule (100 mg total) by mouth 2 (two) times daily.    Dispense:  10 capsule    Refill:  0    Order Specific Question:   Supervising Provider     Answer:   Hildred Laser [[id number]]      Return if symptoms worsen or fail to improve.  Kell Ferris B. Morrell Fluke, PA-C 09/24/2022 12:57 PM

## 2022-09-25 LAB — CERVICOVAGINAL ANCILLARY ONLY
Candida Glabrata: NEGATIVE
Candida Vaginitis: NEGATIVE
Chlamydia: NEGATIVE
Comment: NEGATIVE
Comment: NEGATIVE
Comment: NEGATIVE
Comment: NEGATIVE
Comment: NORMAL
Neisseria Gonorrhea: NEGATIVE
Trichomonas: NEGATIVE

## 2022-09-27 LAB — UPPER RESPIRATORY CULTURE, ROUTINE

## 2022-09-30 LAB — URINE CULTURE

## 2022-10-01 ENCOUNTER — Encounter: Payer: Self-pay | Admitting: Obstetrics and Gynecology

## 2022-10-22 ENCOUNTER — Encounter: Payer: Self-pay | Admitting: Obstetrics and Gynecology

## 2022-10-26 ENCOUNTER — Other Ambulatory Visit: Payer: Self-pay | Admitting: Obstetrics and Gynecology

## 2022-10-26 DIAGNOSIS — N760 Acute vaginitis: Secondary | ICD-10-CM

## 2022-10-26 MED ORDER — CLINDAMYCIN HCL 300 MG PO CAPS: 300.0000 mg | ORAL_CAPSULE | Freq: Two times a day (BID) | ORAL | 0 refills | Status: AC

## 2022-10-26 NOTE — Progress Notes (Signed)
Rx RF clindamycin for BV sx

## 2022-11-04 ENCOUNTER — Other Ambulatory Visit: Payer: Self-pay | Admitting: Obstetrics and Gynecology

## 2022-11-04 ENCOUNTER — Encounter: Payer: Self-pay | Admitting: Obstetrics and Gynecology

## 2022-11-04 DIAGNOSIS — Z3041 Encounter for surveillance of contraceptive pills: Secondary | ICD-10-CM

## 2022-11-04 MED ORDER — NORETHINDRONE 0.35 MG PO TABS: 1.0000 | ORAL_TABLET | Freq: Every day | ORAL | 0 refills | Status: DC

## 2022-12-10 ENCOUNTER — Encounter: Payer: Self-pay | Admitting: Nurse Practitioner

## 2022-12-13 NOTE — Progress Notes (Unsigned)
PCP:  [name], NP   No chief complaint on file.    HPI:      [name] is a 37 y.o. [phone] whose LMP was No LMP recorded., presents today for her annual examination.  Her menses are regular every 28-30 days, lasting 4-5 days on OCPs.  Dysmenorrhea mild, occurring first 1-2 days of flow. She does not have intermenstrual bleeding. Hx of menorrhagia in past, improved with OCPs. Tried Mirena but had pelvic pain and dyspareunia, so removed without change of sx.   Sex activity: single partner, contraception - OCP (estrogen/progesterone). Hx of CPP/dyspareunia for many yrs--sx only with sex and in 1 area mid abdominal area. Had had neg GYN u/s. Dx lap vs amb ref to vascular for pelvic congestion syndrome discussed at 2/21 appt with Dr. Jerene Pitch. Pt to f/u prn.  NOW having some pelvic discomfort for 2-3 wks, more of an ache. Worse in AM. Also with increased vag d/c with mild irritation, no fishy odor. Hx of BV in past, last treated 10/22. Not recently sex active. Also having RT shoulder and RT knee discomfort. Has a job with bending and moving. No GI, urin sx. Currently having her period.  Last Pap: 06/28/19  Results were: no abnormalities /neg HPV DNA  Hx of recurrent BV, confirmed on nuswab. Last treated 11/23 with clindamycin  There is no FH of breast cancer. There is no FH of ovarian cancer. The patient does do self-breast exams.  Tobacco use: smokes 1/2 ppd, quit in past; not fully ready to quit Alcohol use: none No drug use.  Exercise: moderately active  She does get adequate calcium but not Vitamin D in her diet.   Patient Active Problem List   Diagnosis Date Noted   Acute non-recurrent pansinusitis 02/23/2022   Smoking trying to quit 01/02/2022   H/O iron deficiency 01/02/2022   Bilateral carpal tunnel syndrome 11/24/2021   Body mass index (BMI) of 39.0-39.9 in adult 11/24/2021   Microhematuria 04/17/2021   Anemia 02/17/2021   Obesity 02/17/2021   Tobacco  use disorder 08/07/2019   Low vitamin D level 07/07/2019   Current moderate episode of major depressive disorder without prior episode (HCC) 07/27/2018   Generalized anxiety disorder 07/27/2018   Bacterial vaginitis 05/27/2018   Antibiotic-induced yeast infection 05/27/2018     Past Medical History:  Diagnosis Date   Anxiety and depression    Ectopic pregnancy    Obesity    Recurrent vaginitis     Past Surgical History:  Procedure Laterality Date   ADENOIDECTOMY  72   DILATION AND CURETTAGE OF UTERUS  2010   ECTOPIC PREGNANCY SURGERY  03/22/2005   Dr Toya Smothers   TONSILLECTOMY  2006-03-22    Family History  Problem Relation Age of Onset   Diabetes Mellitus II Mother    COPD Mother    Arthritis Mother    Stroke Father    Hypertension Father    Brain cancer Father        meningioma-died 03-22-13  Diabetes Father     Social History   Socioeconomic History   Marital status: Single    Spouse name: Not on file   Number of children: Not on file   Years of education: Not on file   Highest education level: Not on file  Occupational History   Not on file  Tobacco Use   Smoking status: Every Day    Types: Cigarettes   Smokeless tobacco: Never  Vaping  Use   Vaping Use: Never used  Substance and Sexual Activity   Alcohol use: Yes    Comment: occasionally   Drug use: No   Sexual activity: Not Currently    Birth control/protection: Pill  Other Topics Concern   Not on file  Social History Narrative   Not on file   Social Determinants of Health   Financial Resource Strain: Not on file  Food Insecurity: Not on file  Transportation Needs: Not on file  Physical Activity: Not on file  Stress: Not on file  Social Connections: Not on file  Intimate Partner Violence: Not on file     Current Outpatient Medications:    busPIRone (BUSPAR) 10 MG tablet, TAKE 1/2 TO 1 TABLET BY MOUTH TWICE DAILY AS NEEDED FOR ANXIETY, Disp: 180 tablet, Rfl: 1   ergocalciferol (DRISDOL)  1.25 MG (50000 UT) capsule, Take 1 capsule (50,000 Units total) by mouth once a week., Disp: 4 capsule, Rfl: 5   meloxicam (MOBIC) 7.5 MG tablet, Take 1 tablet (7.5 mg total) by mouth daily., Disp: 30 tablet, Rfl: 2   nitrofurantoin, macrocrystal-monohydrate, (MACROBID) 100 MG capsule, Take 1 capsule (100 mg total) by mouth 2 (two) times daily., Disp: 10 capsule, Rfl: 0   norethindrone (MICRONOR) 0.35 MG tablet, Take 1 tablet (0.35 mg total) by mouth daily., Disp: 84 tablet, Rfl: 0   valACYclovir (VALTREX) 500 MG tablet, TAKE 1 TABLET (500 MG TOTAL) BY MOUTH DAILY., Disp: 90 tablet, Rfl: 2   ROS:  Review of Systems  Constitutional:  Negative for fatigue, fever and unexpected weight change.  Respiratory:  Negative for cough, shortness of breath and wheezing.   Cardiovascular:  Negative for chest pain, palpitations and leg swelling.  Gastrointestinal:  Negative for blood in stool, constipation, diarrhea, nausea and vomiting.  Endocrine: Negative for cold intolerance, heat intolerance and polyuria.  Genitourinary:  Positive for dyspareunia and vaginal discharge. Negative for dysuria, flank pain, frequency, genital sores, hematuria, menstrual problem, pelvic pain, urgency, vaginal bleeding and vaginal pain.  Musculoskeletal:  Negative for back pain, joint swelling and myalgias.  Skin:  Negative for rash.  Neurological:  Negative for dizziness, syncope, light-headedness, numbness and headaches.  Hematological:  Negative for adenopathy.  Psychiatric/Behavioral:  Negative for agitation, confusion, sleep disturbance and suicidal ideas. The patient is not nervous/anxious.    BREAST: No symptoms   Objective: There were no vitals taken for this visit.   Physical Exam Constitutional:      Appearance: She is well-developed.  Genitourinary:     Vulva normal.     Genitourinary Comments: TENDER TO PALPATE RT UMBILICAL AREA     Right Labia: No rash, tenderness or lesions.    Left Labia: No  tenderness, lesions or rash.    Vaginal bleeding present.     No vaginal discharge, erythema or tenderness.      Right Adnexa: not tender and no mass present.    Left Adnexa: not tender and no mass present.    No cervical friability or polyp.     Uterus is tender.     Uterus is not enlarged.  Breasts:    Right: No mass, nipple discharge, skin change or tenderness.     Left: No mass, nipple discharge, skin change or tenderness.  Neck:     Thyroid: No thyromegaly.  Cardiovascular:     Rate and Rhythm: Normal rate and regular rhythm.     Heart sounds: Normal heart sounds. No murmur heard. Pulmonary:  Effort: Pulmonary effort is normal.     Breath sounds: Normal breath sounds.  Abdominal:     Palpations: Abdomen is soft.     Tenderness: There is abdominal tenderness in the suprapubic area. There is no guarding or rebound.  Musculoskeletal:        General: Normal range of motion.     Cervical back: Normal range of motion.  Lymphadenopathy:     Cervical: No cervical adenopathy.  Neurological:     General: No focal deficit present.     Mental Status: She is alert and oriented to person, place, and time.     Cranial Nerves: No cranial nerve deficit.  Skin:    General: Skin is warm and dry.  Psychiatric:        Mood and Affect: Mood normal.        Behavior: Behavior normal.        Thought Content: Thought content normal.        Judgment: Judgment normal.  Vitals reviewed.     Assessment/Plan: Encounter for annual routine gynecological examination  Encounter for surveillance of contraceptive pills - Plan: norethindrone (MICRONOR) 0.35 MG tablet; OCP change to POPs due to tob use. Discussed other prog only options. Pt with hx of menorrhagia so will have to see how POPs work. Aware to take at same time daily vs need for condoms/abstinence. Can return to estrogen OCPs if stops smoking.   Vaginal discharge - Plan: POCT Wet Prep with KOH; neg wet prep, on period. No evid of BV.  F/u if sx progress.   Pelvic pain--slightly tender on exam. Question MSK. F/u if sx persist for GYN u/s.    No orders of the defined types were placed in this encounter.             GYN counsel adequate intake of calcium and vitamin D, diet and exercise, tobacco cessation     F/U  No follow-ups on file.  Lona Six B. Jayion Schneck, PA-C 12/13/2022 7:08 PM

## 2022-12-14 ENCOUNTER — Encounter: Payer: Self-pay | Admitting: Obstetrics and Gynecology

## 2022-12-14 ENCOUNTER — Other Ambulatory Visit (HOSPITAL_COMMUNITY)
Admission: RE | Admit: 2022-12-14 | Discharge: 2022-12-14 | Disposition: A | Discharge status: Home / Self Care | Payer: BLUE CROSS/BLUE SHIELD | Source: Ambulatory Visit | Attending: Obstetrics and Gynecology | Admitting: Obstetrics and Gynecology

## 2022-12-14 ENCOUNTER — Ambulatory Visit (INDEPENDENT_AMBULATORY_CARE_PROVIDER_SITE_OTHER): Payer: BLUE CROSS/BLUE SHIELD | Admitting: Obstetrics and Gynecology

## 2022-12-14 ENCOUNTER — Other Ambulatory Visit: Payer: Self-pay | Admitting: Internal Medicine

## 2022-12-14 VITALS — BP 122/79 | HR 78 | Ht 64.0 in | Wt 241.0 lb

## 2022-12-14 DIAGNOSIS — N898 Other specified noninflammatory disorders of vagina: Secondary | ICD-10-CM

## 2022-12-14 DIAGNOSIS — Z124 Encounter for screening for malignant neoplasm of cervix: Secondary | ICD-10-CM | POA: Diagnosis not present

## 2022-12-14 DIAGNOSIS — Z1151 Encounter for screening for human papillomavirus (HPV): Secondary | ICD-10-CM | POA: Diagnosis present

## 2022-12-14 DIAGNOSIS — Z20822 Contact with and (suspected) exposure to covid-19: Secondary | ICD-10-CM

## 2022-12-14 DIAGNOSIS — F411 Generalized anxiety disorder: Secondary | ICD-10-CM

## 2022-12-14 DIAGNOSIS — Z01411 Encounter for gynecological examination (general) (routine) with abnormal findings: Secondary | ICD-10-CM

## 2022-12-14 DIAGNOSIS — E669 Obesity, unspecified: Secondary | ICD-10-CM

## 2022-12-14 DIAGNOSIS — B9689 Other specified bacterial agents as the cause of diseases classified elsewhere: Secondary | ICD-10-CM | POA: Diagnosis not present

## 2022-12-14 DIAGNOSIS — N760 Acute vaginitis: Secondary | ICD-10-CM

## 2022-12-14 DIAGNOSIS — A6004 Herpesviral vulvovaginitis: Secondary | ICD-10-CM | POA: Insufficient documentation

## 2022-12-14 DIAGNOSIS — F3341 Major depressive disorder, recurrent, in partial remission: Secondary | ICD-10-CM

## 2022-12-14 DIAGNOSIS — F17210 Nicotine dependence, cigarettes, uncomplicated: Secondary | ICD-10-CM

## 2022-12-14 DIAGNOSIS — Z01419 Encounter for gynecological examination (general) (routine) without abnormal findings: Secondary | ICD-10-CM

## 2022-12-14 DIAGNOSIS — R300 Dysuria: Secondary | ICD-10-CM

## 2022-12-14 DIAGNOSIS — Z0001 Encounter for general adult medical examination with abnormal findings: Secondary | ICD-10-CM

## 2022-12-14 DIAGNOSIS — Z3041 Encounter for surveillance of contraceptive pills: Secondary | ICD-10-CM

## 2022-12-14 LAB — POCT WET PREP WITH KOH
Clue Cells Wet Prep HPF POC: POSITIVE
KOH Prep POC: POSITIVE — AB
Trichomonas, UA: NEGATIVE
Yeast Wet Prep HPF POC: NEGATIVE

## 2022-12-14 MED ORDER — FLUCONAZOLE 150 MG PO TABS: 150.0000 mg | ORAL_TABLET | Freq: Once | ORAL | 0 refills | Status: AC

## 2022-12-14 MED ORDER — CLINDAMYCIN HCL 300 MG PO CAPS: 300.0000 mg | ORAL_CAPSULE | Freq: Two times a day (BID) | ORAL | 0 refills | Status: AC

## 2022-12-14 MED ORDER — NORETHINDRONE 0.35 MG PO TABS: 1.0000 | ORAL_TABLET | Freq: Every day | ORAL | 3 refills | Status: DC

## 2022-12-14 MED ORDER — VALACYCLOVIR HCL 500 MG PO TABS: 500.0000 mg | ORAL_TABLET | Freq: Every day | ORAL | 3 refills | Status: DC

## 2022-12-14 NOTE — Patient Instructions (Signed)
I value your feedback and you entrusting us with your care. If you get a [facility] patient survey, I would appreciate you taking the time to let us know about your experience today. Thank you! ? ? ?

## 2022-12-16 LAB — CYTOLOGY - PAP
Comment: NEGATIVE
Diagnosis: NEGATIVE
High risk HPV: NEGATIVE

## 2022-12-20 ENCOUNTER — Encounter: Payer: Self-pay | Admitting: Nurse Practitioner

## 2022-12-21 ENCOUNTER — Other Ambulatory Visit: Payer: Self-pay | Admitting: Nurse Practitioner

## 2022-12-21 DIAGNOSIS — J069 Acute upper respiratory infection, unspecified: Secondary | ICD-10-CM

## 2022-12-21 MED ORDER — MOLNUPIRAVIR EUA 200MG CAPSULE: 4.0000 | ORAL_CAPSULE | Freq: Two times a day (BID) | ORAL | 0 refills | Status: AC

## 2023-01-05 ENCOUNTER — Other Ambulatory Visit: Payer: Self-pay | Admitting: Advanced Practice Midwife

## 2023-01-05 DIAGNOSIS — A6004 Herpesviral vulvovaginitis: Secondary | ICD-10-CM

## 2023-01-09 ENCOUNTER — Encounter: Payer: Self-pay | Admitting: Obstetrics and Gynecology

## 2023-01-11 ENCOUNTER — Other Ambulatory Visit: Payer: Self-pay | Admitting: Obstetrics and Gynecology

## 2023-01-11 DIAGNOSIS — B9689 Other specified bacterial agents as the cause of diseases classified elsewhere: Secondary | ICD-10-CM

## 2023-01-11 MED ORDER — METRONIDAZOLE 500 MG PO TABS: ORAL_TABLET | ORAL | 0 refills | Status: DC

## 2023-01-11 MED ORDER — METRONIDAZOLE 0.75 % VA GEL: VAGINAL | 2 refills | Status: DC

## 2023-01-11 NOTE — Progress Notes (Signed)
Rx RF flagyl for recurrent BV, then do metrogel twice wkly as preventive

## 2023-01-13 ENCOUNTER — Telehealth: Payer: Self-pay | Admitting: *Deleted

## 2023-01-13 ENCOUNTER — Other Ambulatory Visit: Payer: Self-pay | Admitting: Nurse Practitioner

## 2023-01-13 DIAGNOSIS — Z0001 Encounter for general adult medical examination with abnormal findings: Secondary | ICD-10-CM

## 2023-01-13 DIAGNOSIS — Z20822 Contact with and (suspected) exposure to covid-19: Secondary | ICD-10-CM

## 2023-01-13 DIAGNOSIS — F17210 Nicotine dependence, cigarettes, uncomplicated: Secondary | ICD-10-CM

## 2023-01-13 DIAGNOSIS — F3341 Major depressive disorder, recurrent, in partial remission: Secondary | ICD-10-CM

## 2023-01-13 DIAGNOSIS — F411 Generalized anxiety disorder: Secondary | ICD-10-CM

## 2023-01-13 DIAGNOSIS — E669 Obesity, unspecified: Secondary | ICD-10-CM

## 2023-01-13 DIAGNOSIS — R300 Dysuria: Secondary | ICD-10-CM

## 2023-01-13 MED ORDER — BUSPIRONE HCL 10 MG PO TABS: 5.0000 mg | ORAL_TABLET | Freq: Two times a day (BID) | ORAL | 1 refills | Status: DC | PRN

## 2023-01-13 NOTE — Telephone Encounter (Signed)
I have refilled her buspirone to last through until her visit 02/23/2023

## 2023-01-13 NOTE — Telephone Encounter (Signed)
Pt called to schedule an appointment to get her anxiety medication refilled.  She wanted to see if she could get some medication sent in to last until her appointment time.  She is scheduled to come in on 02/22/23 (first available).  She would like a refill on below.       busPIRone (BUSPAR) 10 MG tablet   [facility] [id number] [address], [place] - [place]

## 2023-01-13 NOTE — Telephone Encounter (Signed)
Pt informed of below.Rylin Seavey Zimmerman Rumple, CMA ? ?

## 2023-01-20 ENCOUNTER — Encounter: Payer: Self-pay | Admitting: Obstetrics and Gynecology

## 2023-01-25 ENCOUNTER — Other Ambulatory Visit: Payer: Self-pay

## 2023-01-25 DIAGNOSIS — R300 Dysuria: Secondary | ICD-10-CM

## 2023-01-25 DIAGNOSIS — F17210 Nicotine dependence, cigarettes, uncomplicated: Secondary | ICD-10-CM

## 2023-01-25 DIAGNOSIS — F3341 Major depressive disorder, recurrent, in partial remission: Secondary | ICD-10-CM

## 2023-01-25 DIAGNOSIS — E669 Obesity, unspecified: Secondary | ICD-10-CM

## 2023-01-25 DIAGNOSIS — Z20822 Contact with and (suspected) exposure to covid-19: Secondary | ICD-10-CM

## 2023-01-25 DIAGNOSIS — Z0001 Encounter for general adult medical examination with abnormal findings: Secondary | ICD-10-CM

## 2023-01-25 DIAGNOSIS — F411 Generalized anxiety disorder: Secondary | ICD-10-CM

## 2023-01-25 MED ORDER — BUSPIRONE HCL 10 MG PO TABS: 5.0000 mg | ORAL_TABLET | Freq: Two times a day (BID) | ORAL | 1 refills | Status: DC | PRN

## 2023-02-22 ENCOUNTER — Encounter: Payer: Self-pay | Admitting: Nurse Practitioner

## 2023-02-22 ENCOUNTER — Ambulatory Visit (INDEPENDENT_AMBULATORY_CARE_PROVIDER_SITE_OTHER): Payer: BLUE CROSS/BLUE SHIELD | Admitting: Nurse Practitioner

## 2023-02-22 VITALS — BP 115/77 | HR 79 | Ht 64.0 in | Wt 243.1 lb

## 2023-02-22 DIAGNOSIS — F411 Generalized anxiety disorder: Secondary | ICD-10-CM

## 2023-02-22 DIAGNOSIS — E559 Vitamin D deficiency, unspecified: Secondary | ICD-10-CM

## 2023-02-22 DIAGNOSIS — M25562 Pain in left knee: Secondary | ICD-10-CM

## 2023-02-22 DIAGNOSIS — F3341 Major depressive disorder, recurrent, in partial remission: Secondary | ICD-10-CM

## 2023-02-22 DIAGNOSIS — R7301 Impaired fasting glucose: Secondary | ICD-10-CM | POA: Diagnosis not present

## 2023-02-22 DIAGNOSIS — R635 Abnormal weight gain: Secondary | ICD-10-CM

## 2023-02-22 DIAGNOSIS — R5383 Other fatigue: Secondary | ICD-10-CM

## 2023-02-22 MED ORDER — CELECOXIB 200 MG PO CAPS: 200.0000 mg | ORAL_CAPSULE | Freq: Two times a day (BID) | ORAL | 1 refills | Status: DC | PRN

## 2023-02-22 MED ORDER — BUSPIRONE HCL 10 MG PO TABS: 5.0000 mg | ORAL_TABLET | Freq: Two times a day (BID) | ORAL | 2 refills | Status: DC | PRN

## 2023-02-22 NOTE — Progress Notes (Signed)
Established patient visit   Patient: Marilyn Li   DOB: 1986-02-08   37 y.o. Female  MRN: [id number] Visit Date: 02/22/2023   Chief Complaint  Patient presents with   Medical Management of Chronic Issues   Subjective    HPI  Follow up  -increased anxiety  -her daughter's grandmother passed away on new year's eve  -she was very close to her.  -is taking buspirone once daily most days.  -feels as though this is doing a good job with anxiety Does have rx for wellbutrin 75 mg up to twice daily -prescribed to help with smoking cessation. She has never started it  Concern about weight.   Left knee pain  -for about a week.  -hurts when she bends it or sitting in certain positions.  -has to prop it up  -used heat which helped some  -has alternated tylenol with ibuprofen which has helped very little     Medications: Outpatient Medications Prior to Visit  Medication Sig   buPROPion (WELLBUTRIN) 75 MG tablet Take 75 mg by mouth 2 (two) times daily.   ergocalciferol (DRISDOL) 1.25 MG (50000 UT) capsule Take 1 capsule (50,000 Units total) by mouth once a week.   meloxicam (MOBIC) 7.5 MG tablet Take 1 tablet (7.5 mg total) by mouth daily.   metroNIDAZOLE (METROGEL) 0.75 % vaginal gel Apply one applicatorful to vagina twice weekly for 3 months as preventive   norethindrone (MICRONOR) 0.35 MG tablet Take 1 tablet (0.35 mg total) by mouth daily.   valACYclovir (VALTREX) 500 MG tablet TAKE 1 TABLET (500 MG TOTAL) BY MOUTH DAILY.   [DISCONTINUED] busPIRone (BUSPAR) 10 MG tablet Take 0.5-1 tablets (5-10 mg total) by mouth 2 (two) times daily as needed. for anxiety   [DISCONTINUED] metroNIDAZOLE (FLAGYL) 500 MG tablet Take 1 tab BID for 7 days; NO alcohol use for 10 days after prescription start   No facility-administered medications prior to visit.    Review of Systems See HPI     Last CBC Lab Results  Component Value Date   WBC 6.9 01/02/2022   HGB 13.2 01/02/2022   HCT 38.4  01/02/2022   MCV 90 01/02/2022   MCH 31.0 01/02/2022   RDW 11.8 01/02/2022   PLT 282 01/02/2022   Last metabolic panel Lab Results  Component Value Date   GLUCOSE 85 01/02/2022   NA 140 01/02/2022   K 4.5 01/02/2022   CL 106 01/02/2022   CO2 23 01/02/2022   BUN 10 01/02/2022   CREATININE 0.87 01/02/2022   EGFR 88 01/02/2022   CALCIUM 8.6 (L) 01/02/2022   PROT 6.5 01/02/2022   ALBUMIN 4.0 01/02/2022   LABGLOB 2.5 01/02/2022   AGRATIO 1.6 01/02/2022   BILITOT 0.5 01/02/2022   ALKPHOS 48 01/02/2022   AST 18 01/02/2022   ALT 10 01/02/2022   ANIONGAP 6 01/05/2019   Last lipids Lab Results  Component Value Date   CHOL 132 01/02/2022   HDL 51 01/02/2022   LDLCALC 70 01/02/2022   TRIG 51 01/02/2022   CHOLHDL 2.6 01/02/2022   Last hemoglobin A1c Lab Results  Component Value Date   HGBA1C 5.3 01/02/2022   Last thyroid functions Lab Results  Component Value Date   TSH 1.480 01/02/2022   Last vitamin D Lab Results  Component Value Date   VD25OH 14.8 (L) 01/02/2022       Objective     Today's Vitals   02/22/23 1330  BP: 115/77  Pulse: 79  SpO2: 98%  Weight: 243 lb 1.9 oz (110.3 kg)  Height: 5\' 4"  (1.626 m)   Body mass index is 41.73 kg/m.  BP Readings from Last 3 Encounters:  03/01/23 120/80  02/22/23 115/77  12/14/22 122/79    Wt Readings from Last 3 Encounters:  03/01/23 250 lb (113.4 kg)  02/22/23 243 lb 1.9 oz (110.3 kg)  12/14/22 241 lb (109.3 kg)    Physical Exam Vitals and nursing note reviewed.  Constitutional:      Appearance: Normal appearance. She is well-developed. She is obese.  HENT:     Head: Normocephalic and atraumatic.     Nose: Nose normal.     Mouth/Throat:     Mouth: Mucous membranes are moist.     Pharynx: Oropharynx is clear.  Eyes:     Extraocular Movements: Extraocular movements intact.     Conjunctiva/sclera: Conjunctivae normal.     Pupils: Pupils are equal, round, and reactive to light.  Neck:     Vascular: No  carotid bruit.  Cardiovascular:     Rate and Rhythm: Normal rate and regular rhythm.     Pulses: Normal pulses.     Heart sounds: Normal heart sounds.  Pulmonary:     Effort: Pulmonary effort is normal.     Breath sounds: Normal breath sounds.  Abdominal:     Palpations: Abdomen is soft.  Musculoskeletal:        General: Normal range of motion.     Cervical back: Normal range of motion and neck supple.       Legs:  Lymphadenopathy:     Cervical: No cervical adenopathy.  Skin:    General: Skin is warm and dry.     Capillary Refill: Capillary refill takes less than 2 seconds.  Neurological:     General: No focal deficit present.     Mental Status: She is alert and oriented to person, place, and time.  Psychiatric:        Attention and Perception: Attention and perception normal.        Mood and Affect: Mood is depressed. Affect is tearful.        Speech: Speech normal.        Behavior: Behavior normal. Behavior is cooperative.        Thought Content: Thought content normal.        Cognition and Memory: Cognition and memory normal.        Judgment: Judgment normal.      Assessment & Plan    Recurrent major depressive disorder, in partial remission (HCC) Assessment & Plan: Recommend she start taking bupropion 75 mg, initially prescribed at earlier visit. Reassess in 6 weeks.   Orders: -     busPIRone HCl; Take 0.5-1 tablets (5-10 mg total) by mouth 2 (two) times daily as needed. for anxiety  Dispense: 60 tablet; Refill: 2  Generalized anxiety disorder Assessment & Plan: Take buspar 10 mg, 1/2 to 1 tablet, twice daily as needed for acute anxiety.   Orders: -     busPIRone HCl; Take 0.5-1 tablets (5-10 mg total) by mouth 2 (two) times daily as needed. for anxiety  Dispense: 60 tablet; Refill: 2  Acute pain of left knee Assessment & Plan: -trial celebrex 200 mg twice daily as needed for pain/inflammation.  -Apply a compressive ACE bandage. Rest and elevate the left leg.   Apply cold compresses intermittently as needed.   -consider referral to orthopedics as indicated.   Orders: -     Celecoxib; Take  1 capsule (200 mg total) by mouth 2 (two) times daily as needed.  Dispense: 60 capsule; Refill: 1  Impaired fasting glucose Assessment & Plan: Check labs, including HgbA1c, for further evaluation.   Orders: -     Hemoglobin A1c; Future -     Lipid panel; Future -     Comprehensive metabolic panel; Future -     CBC; Future  Abnormal weight gain Assessment & Plan: Check labs with thyroid panel for further evaluation.   Orders: -     TSH + free T4; Future -     Lipid panel; Future  Vitamin D deficiency Assessment & Plan: Check vitamin d level and treat deficiency as indicated.    Orders: -     VITAMIN D 25 Hydroxy (Vit-D Deficiency, Fractures); Future     Return in about 6 weeks (around 04/05/2023) for mood, weight.         [name], NP  [facility] at [facility] [phone] (phone) [phone] (fax)  [facility]

## 2023-02-24 ENCOUNTER — Encounter: Payer: Self-pay | Admitting: Nurse Practitioner

## 2023-03-01 ENCOUNTER — Encounter: Payer: Self-pay | Admitting: Obstetrics and Gynecology

## 2023-03-01 ENCOUNTER — Other Ambulatory Visit (HOSPITAL_COMMUNITY)
Admission: RE | Admit: 2023-03-01 | Discharge: 2023-03-01 | Disposition: A | Discharge status: Home / Self Care | Payer: BLUE CROSS/BLUE SHIELD | Source: Ambulatory Visit | Attending: Obstetrics and Gynecology | Admitting: Obstetrics and Gynecology

## 2023-03-01 ENCOUNTER — Ambulatory Visit (INDEPENDENT_AMBULATORY_CARE_PROVIDER_SITE_OTHER): Payer: BLUE CROSS/BLUE SHIELD | Admitting: Obstetrics and Gynecology

## 2023-03-01 VITALS — BP 120/80 | Ht 64.0 in | Wt 250.0 lb

## 2023-03-01 DIAGNOSIS — Z113 Encounter for screening for infections with a predominantly sexual mode of transmission: Secondary | ICD-10-CM

## 2023-03-01 DIAGNOSIS — N898 Other specified noninflammatory disorders of vagina: Secondary | ICD-10-CM | POA: Diagnosis not present

## 2023-03-01 DIAGNOSIS — R399 Unspecified symptoms and signs involving the genitourinary system: Secondary | ICD-10-CM

## 2023-03-01 LAB — POCT URINALYSIS DIPSTICK
Bilirubin, UA: NEGATIVE
Glucose, UA: NEGATIVE
Ketones, UA: NEGATIVE
Leukocytes, UA: NEGATIVE
Nitrite, UA: NEGATIVE
Protein, UA: NEGATIVE
Spec Grav, UA: 1.015 (ref 1.010–1.025)
pH, UA: 6.5 (ref 5.0–8.0)

## 2023-03-01 LAB — POCT WET PREP WITH KOH
Clue Cells Wet Prep HPF POC: NEGATIVE
KOH Prep POC: NEGATIVE
Trichomonas, UA: NEGATIVE
Yeast Wet Prep HPF POC: NEGATIVE

## 2023-03-01 NOTE — Progress Notes (Signed)
Ronnell Freshwater, NP   Chief Complaint  Patient presents with   Urinary Tract Infection    Urgency, no burning, little back pain x 4 days   Vaginal Discharge    Soud odor, little itching x 4 days    HPI:      [name] is a 37 y.o. [id number] whose LMP was Patient's last menstrual period was 02/14/2023 (approximate)., presents today for urinary urgency with decreased flow for 4 days, pelvic discomfort and LBP. No dysuria, hematuria. Drinks caffeine but has been cutting back and has increased water intake. Has noticed increased vag d/c with non-fishy odor, mild itching for 4 days. Doesn't feel like her usual BV sx. Doing Metrogel twice wkly and probiotics as preventive. Was sexually active a few wks ago without condom use; no new partners. Wants STD testing. No new soaps/detergents. No meds to treat vag sx.   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   Acute non-recurrent pansinusitis 02/23/2022   Smoking trying to quit 01/02/2022   H/O iron deficiency 01/02/2022   Bilateral carpal tunnel syndrome 11/24/2021   Body mass index (BMI) of 39.0-39.9 in adult 11/24/2021   Microhematuria 04/17/2021   Anemia 02/17/2021   Obesity 02/17/2021   Tobacco use disorder 08/07/2019   Low vitamin D level 07/07/2019   Current moderate episode of major depressive disorder without prior episode 07/27/2018   Generalized anxiety disorder 07/27/2018   Bacterial vaginitis 05/27/2018   Antibiotic-induced yeast infection 05/27/2018    Past Surgical History:  Procedure Laterality Date   ADENOIDECTOMY  1986   DILATION AND CURETTAGE OF UTERUS  03/19/2009   ECTOPIC PREGNANCY SURGERY  19-Mar-2005   Dr Georga Bora   TONSILLECTOMY  19-Mar-2006    Family History  Problem Relation Age of Onset   Diabetes Mellitus II Mother    COPD Mother    Arthritis Mother    Stroke Father    Hypertension Father    Brain cancer Father        meningioma-died 2013-03-19    Diabetes Father     Social History   Socioeconomic History   Marital status: Single    Spouse name: Not on file   Number of children: Not on file   Years of education: Not on file   Highest education level: Not on file  Occupational History   Not on file  Tobacco Use   Smoking status: Every Day    Types: Cigarettes   Smokeless tobacco: Never  Vaping Use   Vaping Use: Never used  Substance and Sexual Activity   Alcohol use: Yes    Comment: occasionally   Drug use: No   Sexual activity: Not Currently    Birth control/protection: Pill  Other Topics Concern   Not on file  Social History Narrative   Not on file   Social Determinants of Health   Financial Resource Strain: Not on file  Food Insecurity: Not on file  Transportation Needs: Not on file  Physical Activity: Not on file  Stress: Not on file  Social Connections: Not on file  Intimate Partner Violence: Not on file    Outpatient Medications Prior to Visit  Medication Sig Dispense Refill   buPROPion (WELLBUTRIN) 75 MG tablet Take 75 mg by mouth 2 (two) times daily.     busPIRone (BUSPAR) 10 MG tablet Take 0.5-1 tablets (5-10 mg total) by mouth 2 (two) times daily as needed. for anxiety  60 tablet 2   celecoxib (CELEBREX) 200 MG capsule Take 1 capsule (200 mg total) by mouth 2 (two) times daily as needed. 60 capsule 1   ergocalciferol (DRISDOL) 1.25 MG (50000 UT) capsule Take 1 capsule (50,000 Units total) by mouth once a week. 4 capsule 5   meloxicam (MOBIC) 7.5 MG tablet Take 1 tablet (7.5 mg total) by mouth daily. 30 tablet 2   metroNIDAZOLE (METROGEL) 0.75 % vaginal gel Apply one applicatorful to vagina twice weekly for 3 months as preventive 70 g 2   norethindrone (MICRONOR) 0.35 MG tablet Take 1 tablet (0.35 mg total) by mouth daily. 84 tablet 3   valACYclovir (VALTREX) 500 MG tablet TAKE 1 TABLET (500 MG TOTAL) BY MOUTH DAILY. 90 tablet 3   No facility-administered medications prior to visit.       ROS:  Review of Systems  Constitutional:  Negative for fever.  Gastrointestinal:  Negative for blood in stool, constipation, diarrhea, nausea and vomiting.  Genitourinary:  Positive for frequency, pelvic pain, urgency and vaginal discharge. Negative for dyspareunia, dysuria, flank pain, hematuria, vaginal bleeding and vaginal pain.  Musculoskeletal:  Positive for back pain.  Skin:  Negative for rash.   BREAST: No symptoms   OBJECTIVE:   Vitals:  BP 120/80   Ht 5\' 4"  (1.626 m)   Wt 250 lb (113.4 kg)   LMP 02/14/2023 (Approximate)   BMI 42.91 kg/m   Physical Exam Vitals reviewed.  Constitutional:      Appearance: She is well-developed. She is not ill-appearing or toxic-appearing.  Pulmonary:     Effort: Pulmonary effort is normal.  Abdominal:     Tenderness: There is abdominal tenderness in the suprapubic area. There is no right CVA tenderness or left CVA tenderness.  Genitourinary:    General: Normal vulva.     Pubic Area: No rash.      Labia:        Right: No rash, tenderness or lesion.        Left: No rash, tenderness or lesion.      Vagina: Normal. No vaginal discharge, erythema or tenderness.     Cervix: Normal.     Uterus: Normal. Not enlarged and not tender.      Adnexa: Right adnexa normal and left adnexa normal.       Right: No mass or tenderness.         Left: No mass or tenderness.    Musculoskeletal:        General: Normal range of motion.       Arms:     Cervical back: Normal range of motion.     Comments: TENDER TO PALPATE LT LOW BACK NEAR FLANK AREA; NO CVAT  Skin:    General: Skin is warm and dry.  Neurological:     General: No focal deficit present.     Mental Status: She is alert and oriented to person, place, and time.     Cranial Nerves: No cranial nerve deficit.  Psychiatric:        Mood and Affect: Mood normal.        Behavior: Behavior normal.        Thought Content: Thought content normal.        Judgment: Judgment normal.      Results: Results for orders placed or performed in visit on 03/01/23 (from the past 24 hour(s))  POCT Wet Prep with KOH     Status: Normal   Collection Time: 03/01/23  4:31  PM  Result Value Ref Range   Trichomonas, UA Negative    Clue Cells Wet Prep HPF POC neg    Epithelial Wet Prep HPF POC     Yeast Wet Prep HPF POC neg    Bacteria Wet Prep HPF POC     RBC Wet Prep HPF POC     WBC Wet Prep HPF POC     KOH Prep POC Negative Negative  POCT Urinalysis Dipstick     Status: Normal   Collection Time: 03/01/23  4:32 PM  Result Value Ref Range   Color, UA yellow    Clarity, UA clear    Glucose, UA Negative Negative   Bilirubin, UA neg    Ketones, UA neg    Spec Grav, UA 1.015 1.010 - 1.025   Blood, UA trace    pH, UA 6.5 5.0 - 8.0   Protein, UA Negative Negative   Urobilinogen, UA     Nitrite, UA neg    Leukocytes, UA Negative Negative   Appearance     Odor       Assessment/Plan: UTI symptoms - Plan: Urine Culture, POCT Urinalysis Dipstick; neg UA, no CVAT (LBP sx are MSK). Check C&S, will treat if pos. Increase water, d/c caffeine.   Vaginal discharge - Plan: Cervicovaginal ancillary only, POCT Wet Prep with KOH; neg exam, neg wet prep. Check culture. If neg, question ext irritation from metrogel. OTC hydrocortisone crm in meantime  Screening for STD (sexually transmitted disease) - Plan: Cervicovaginal ancillary only    Return if symptoms worsen or fail to improve.  Nasiah Polinsky B. Nihar Klus, PA-C 03/01/2023 4:34 PM

## 2023-03-01 NOTE — Patient Instructions (Signed)
I value your feedback and you entrusting us with your care. If you get a [facility] patient survey, I would appreciate you taking the time to let us know about your experience today. Thank you! ? ? ?

## 2023-03-03 ENCOUNTER — Encounter: Payer: Self-pay | Admitting: Obstetrics and Gynecology

## 2023-03-03 LAB — CERVICOVAGINAL ANCILLARY ONLY
Bacterial Vaginitis (gardnerella): NEGATIVE
Candida Glabrata: NEGATIVE
Candida Vaginitis: NEGATIVE
Chlamydia: NEGATIVE
Comment: NEGATIVE
Comment: NEGATIVE
Comment: NEGATIVE
Comment: NEGATIVE
Comment: NEGATIVE
Comment: NORMAL
Neisseria Gonorrhea: NEGATIVE
Trichomonas: NEGATIVE

## 2023-03-03 LAB — URINE CULTURE

## 2023-03-31 DIAGNOSIS — E559 Vitamin D deficiency, unspecified: Secondary | ICD-10-CM | POA: Insufficient documentation

## 2023-03-31 DIAGNOSIS — R635 Abnormal weight gain: Secondary | ICD-10-CM | POA: Insufficient documentation

## 2023-03-31 DIAGNOSIS — R7301 Impaired fasting glucose: Secondary | ICD-10-CM | POA: Insufficient documentation

## 2023-03-31 DIAGNOSIS — F3341 Major depressive disorder, recurrent, in partial remission: Secondary | ICD-10-CM | POA: Insufficient documentation

## 2023-03-31 DIAGNOSIS — M25562 Pain in left knee: Secondary | ICD-10-CM | POA: Insufficient documentation

## 2023-03-31 NOTE — Assessment & Plan Note (Signed)
Check vitamin d level and treat deficiency as indicated.   

## 2023-03-31 NOTE — Assessment & Plan Note (Signed)
Recommend she start taking bupropion 75 mg, initially prescribed at earlier visit. Reassess in 6 weeks.

## 2023-03-31 NOTE — Assessment & Plan Note (Signed)
Check labs, including HgbA1c, for further evaluation.

## 2023-03-31 NOTE — Assessment & Plan Note (Signed)
Check labs with thyroid panel for further evaluation.

## 2023-03-31 NOTE — Assessment & Plan Note (Signed)
Take buspar 10 mg, 1/2 to 1 tablet, twice daily as needed for acute anxiety.

## 2023-03-31 NOTE — Assessment & Plan Note (Deleted)
Recommend she start taking bupropion 75 mg, initially prescribed at earlier visit. Reassess in 6 weeks.  

## 2023-03-31 NOTE — Assessment & Plan Note (Signed)
-  trial celebrex 200 mg twice daily as needed for pain/inflammation.  -Apply a compressive ACE bandage. Rest and elevate the left leg.  Apply cold compresses intermittently as needed.   -consider referral to orthopedics as indicated.

## 2023-04-23 ENCOUNTER — Ambulatory Visit: Payer: BLUE CROSS/BLUE SHIELD | Admitting: Nurse Practitioner

## 2023-04-26 ENCOUNTER — Encounter: Payer: Self-pay | Admitting: Obstetrics and Gynecology

## 2023-04-27 MED ORDER — FLUCONAZOLE 150 MG PO TABS: 150.0000 mg | ORAL_TABLET | Freq: Once | ORAL | 0 refills | Status: AC

## 2023-05-07 ENCOUNTER — Other Ambulatory Visit: Payer: Self-pay

## 2023-05-07 DIAGNOSIS — M25562 Pain in left knee: Secondary | ICD-10-CM

## 2023-05-07 MED ORDER — CELECOXIB 200 MG PO CAPS
200.0000 mg | ORAL_CAPSULE | Freq: Two times a day (BID) | ORAL | 1 refills | Status: DC | PRN
Start: 2023-05-07 — End: 2024-10-05

## 2023-05-21 ENCOUNTER — Ambulatory Visit: Payer: BLUE CROSS/BLUE SHIELD | Admitting: Nurse Practitioner

## 2023-05-28 ENCOUNTER — Ambulatory Visit (INDEPENDENT_AMBULATORY_CARE_PROVIDER_SITE_OTHER): Payer: BLUE CROSS/BLUE SHIELD | Admitting: Nurse Practitioner

## 2023-05-28 ENCOUNTER — Encounter: Payer: Self-pay | Admitting: Nurse Practitioner

## 2023-05-28 VITALS — BP 123/86 | HR 79 | Ht 64.0 in | Wt 246.4 lb

## 2023-05-28 DIAGNOSIS — E785 Hyperlipidemia, unspecified: Secondary | ICD-10-CM | POA: Diagnosis not present

## 2023-05-28 DIAGNOSIS — E559 Vitamin D deficiency, unspecified: Secondary | ICD-10-CM

## 2023-05-28 DIAGNOSIS — F411 Generalized anxiety disorder: Secondary | ICD-10-CM

## 2023-05-28 DIAGNOSIS — R7301 Impaired fasting glucose: Secondary | ICD-10-CM

## 2023-05-28 DIAGNOSIS — F3341 Major depressive disorder, recurrent, in partial remission: Secondary | ICD-10-CM

## 2023-05-28 DIAGNOSIS — R635 Abnormal weight gain: Secondary | ICD-10-CM

## 2023-05-28 DIAGNOSIS — Z720 Tobacco use: Secondary | ICD-10-CM

## 2023-05-28 MED ORDER — BUPROPION HCL 75 MG PO TABS
75.0000 mg | ORAL_TABLET | Freq: Two times a day (BID) | ORAL | 3 refills | Status: DC
Start: 2023-05-28 — End: 2023-08-10

## 2023-05-28 MED ORDER — BUSPIRONE HCL 10 MG PO TABS
5.0000 mg | ORAL_TABLET | Freq: Two times a day (BID) | ORAL | 3 refills | Status: AC | PRN
Start: 2023-05-28 — End: ?

## 2023-05-28 NOTE — Assessment & Plan Note (Signed)
Check fasting lipids with routine labs today  -treat dyslipidemia as indicated

## 2023-05-28 NOTE — Assessment & Plan Note (Signed)
Encouraged her to add in bupropion as previously prescribed.  -new prescription sent to her pharmacy today -reassess in 3 months

## 2023-05-28 NOTE — Assessment & Plan Note (Signed)
May continue buspirone as needed and as prescribed -reassess In 3 months

## 2023-05-28 NOTE — Assessment & Plan Note (Signed)
Check vitamin d level and treat deficiency as indicated.   

## 2023-05-28 NOTE — Assessment & Plan Note (Signed)
Addition of wellbutrin 75 mg twice daily to help decrease appetite and encourage weight loss.  Discussed lowering calorie intake to 1500 calories per day and incorporating exercise into daily routine to help lose weight. 

## 2023-05-28 NOTE — Progress Notes (Signed)
Established patient visit   Patient: Marilyn Li   DOB: 1986-05-18   37 y.o. Female  MRN: [id number] Visit Date: 05/28/2023   Chief Complaint  Patient presents with   Medical Management of Chronic Issues   Subjective    HPI  Follow up  Generalized anxiety -overall, states that she is doing better.  --states that she has stopped taking the buspirone as she hasn't needed to take this  --states that she never started the wellbutrin.  -concern for weight.  -weight loss of 4 pounds since last seen.  -has stopped drinking soda and increased her water intake.  -she has also started taking Bliss Keto Gummies to help with her weight loss journey.  -she has a goal weight of 200 pounds -needs to have routine, fasting labs done today.   She is a current smoker.  -smokes about 1/2 pack of cigarettes per day.  Bupropion initially prescribed by different provider to help her with smoking cessation.    Medications: Outpatient Medications Prior to Visit  Medication Sig   celecoxib (CELEBREX) 200 MG capsule Take 1 capsule (200 mg total) by mouth 2 (two) times daily as needed.   meloxicam (MOBIC) 7.5 MG tablet Take 1 tablet (7.5 mg total) by mouth daily.   metroNIDAZOLE (METROGEL) 0.75 % vaginal gel Apply one applicatorful to vagina twice weekly for 3 months as preventive   norethindrone (MICRONOR) 0.35 MG tablet Take 1 tablet (0.35 mg total) by mouth daily.   valACYclovir (VALTREX) 500 MG tablet TAKE 1 TABLET (500 MG TOTAL) BY MOUTH DAILY.   [DISCONTINUED] buPROPion (WELLBUTRIN) 75 MG tablet Take 75 mg by mouth 2 (two) times daily.   [DISCONTINUED] busPIRone (BUSPAR) 10 MG tablet Take 0.5-1 tablets (5-10 mg total) by mouth 2 (two) times daily as needed. for anxiety   [DISCONTINUED] ergocalciferol (DRISDOL) 1.25 MG (50000 UT) capsule Take 1 capsule (50,000 Units total) by mouth once a week.   No facility-administered medications prior to visit.    Review of Systems See HPI       Objective     Today's Vitals   05/28/23 0816  BP: 123/86  Pulse: 79  SpO2: 98%  Weight: 246 lb 6.4 oz (111.8 kg)  Height: 5\' 4"  (1.626 m)   Body mass index is 42.29 kg/m.  BP Readings from Last 3 Encounters:  05/28/23 123/86  03/01/23 120/80  02/22/23 115/77    Wt Readings from Last 3 Encounters:  05/28/23 246 lb 6.4 oz (111.8 kg)  03/01/23 250 lb (113.4 kg)  02/22/23 243 lb 1.9 oz (110.3 kg)    Physical Exam Vitals and nursing note reviewed.  Constitutional:      Appearance: Normal appearance. She is well-developed. She is obese.  HENT:     Head: Normocephalic and atraumatic.     Nose: Nose normal.     Mouth/Throat:     Mouth: Mucous membranes are moist.     Pharynx: Oropharynx is clear.  Eyes:     Extraocular Movements: Extraocular movements intact.     Conjunctiva/sclera: Conjunctivae normal.     Pupils: Pupils are equal, round, and reactive to light.  Neck:     Vascular: No carotid bruit.  Cardiovascular:     Rate and Rhythm: Normal rate and regular rhythm.     Pulses: Normal pulses.     Heart sounds: Normal heart sounds.  Pulmonary:     Effort: Pulmonary effort is normal.     Breath sounds: Normal breath sounds.  Abdominal:  Palpations: Abdomen is soft.  Musculoskeletal:        General: Normal range of motion.     Cervical back: Normal range of motion and neck supple.  Lymphadenopathy:     Cervical: No cervical adenopathy.  Skin:    General: Skin is warm and dry.     Capillary Refill: Capillary refill takes less than 2 seconds.  Neurological:     General: No focal deficit present.     Mental Status: She is alert and oriented to person, place, and time.  Psychiatric:        Mood and Affect: Mood normal.        Behavior: Behavior normal.        Thought Content: Thought content normal.        Judgment: Judgment normal.      Assessment & Plan    Abnormal weight gain Assessment & Plan: Check thyroid panel today   Orders: -     TSH + free  T4; Future -     Comprehensive metabolic panel; Future -     CBC; Future -     buPROPion HCl; Take 1 tablet (75 mg total) by mouth 2 (two) times daily.  Dispense: 60 tablet; Refill: 3  Dyslipidemia, goal LDL below 100 Assessment & Plan: Check fasting lipids with routine labs today  -treat dyslipidemia as indicated   Orders: -     Lipid panel; Future -     Comprehensive metabolic panel; Future -     CBC; Future  Impaired fasting glucose Assessment & Plan: Check HgbA1c with routine labs today.   Orders: -     Hemoglobin A1c; Future  Recurrent major depressive disorder, in partial remission [facility]) Assessment & Plan: Encouraged her to add in bupropion as previously prescribed.  -new prescription sent to her pharmacy today -reassess in 3 months   Orders: -     buPROPion HCl; Take 1 tablet (75 mg total) by mouth 2 (two) times daily.  Dispense: 60 tablet; Refill: 3 -     busPIRone HCl; Take 0.5-1 tablets (5-10 mg total) by mouth 2 (two) times daily as needed. for anxiety  Dispense: 60 tablet; Refill: 3  Generalized anxiety disorder Assessment & Plan: May continue buspirone as needed and as prescribed -reassess In 3 months   Orders: -     buPROPion HCl; Take 1 tablet (75 mg total) by mouth 2 (two) times daily.  Dispense: 60 tablet; Refill: 3 -     busPIRone HCl; Take 0.5-1 tablets (5-10 mg total) by mouth 2 (two) times daily as needed. for anxiety  Dispense: 60 tablet; Refill: 3  Class 3 severe obesity due to excess calories without serious comorbidity with body mass index (BMI) of 40.0 to 44.9 in adult [facility]) Assessment & Plan: Addition of wellbutrin 75 mg twice daily to help decrease appetite and encourage weight loss.  Discussed lowering calorie intake to 1500 calories per day and incorporating exercise into daily routine to help lose weight.   Vitamin D deficiency Assessment & Plan: Check vitamin d level and treat deficiency as indicated.    Orders: -     VITAMIN D 25  Hydroxy (Vit-D Deficiency, Fractures); Future  Smoking trying to quit Assessment & Plan: Encouraged addition of wellbutrin 75 mg twice daily to help with smoking cessation.  -reassess in 3 months       Return in about 3 months (around 08/28/2023) for mood, routine - weight management.  Ronnell Freshwater, NP  [facility] [phone] (phone) [phone] (fax)  [place]

## 2023-05-28 NOTE — Assessment & Plan Note (Signed)
Encouraged addition of wellbutrin 75 mg twice daily to help with smoking cessation.  -reassess in 3 months

## 2023-05-28 NOTE — Assessment & Plan Note (Deleted)
Addition of wellbutrin 75 mg twice daily to help decrease appetite and encourage weight loss.  Discussed lowering calorie intake to 1500 calories per day and incorporating exercise into daily routine to help lose weight.

## 2023-05-28 NOTE — Assessment & Plan Note (Signed)
>>  ASSESSMENT AND PLAN FOR OBESITY WRITTEN ON 05/28/2023  8:45 AM BY BOSCIA, HEATHER E, NP  Addition of wellbutrin 75 mg twice daily to help decrease appetite and encourage weight loss.  Discussed lowering calorie intake to 1500 calories per day and incorporating exercise into daily routine to help lose weight.

## 2023-05-28 NOTE — Assessment & Plan Note (Signed)
Check HgbA1c with routine labs today.

## 2023-05-28 NOTE — Assessment & Plan Note (Signed)
>>  ASSESSMENT AND PLAN FOR SMOKING TRYING TO QUIT WRITTEN ON 05/28/2023  8:44 AM BY BOSCIA, HEATHER E, NP  Encouraged addition of wellbutrin 75 mg twice daily to help with smoking cessation.  -reassess in 3 months

## 2023-05-28 NOTE — Assessment & Plan Note (Signed)
Check thyroid panel today 

## 2023-05-29 LAB — COMPREHENSIVE METABOLIC PANEL
ALT: 8 IU/L (ref 0–32)
AST: 15 IU/L (ref 0–40)
Albumin: 4.1 g/dL (ref 3.9–4.9)
Alkaline Phosphatase: 59 IU/L (ref 44–121)
BUN/Creatinine Ratio: 13 (ref 9–23)
BUN: 12 mg/dL (ref 6–20)
Bilirubin Total: 0.3 mg/dL (ref 0.0–1.2)
CO2: 23 mmol/L (ref 20–29)
Calcium: 9.1 mg/dL (ref 8.7–10.2)
Chloride: 106 mmol/L (ref 96–106)
Creatinine, Ser: 0.89 mg/dL (ref 0.57–1.00)
Globulin, Total: 2.6 g/dL (ref 1.5–4.5)
Glucose: 90 mg/dL (ref 70–99)
Potassium: 4.4 mmol/L (ref 3.5–5.2)
Sodium: 140 mmol/L (ref 134–144)
Total Protein: 6.7 g/dL (ref 6.0–8.5)
eGFR: 85 mL/min/{1.73_m2} (ref >59)

## 2023-05-29 LAB — CBC
Hematocrit: 39.7 % (ref 34.0–46.6)
Hemoglobin: 12.7 g/dL (ref 11.1–15.9)
MCH: 30.5 pg (ref 26.6–33.0)
MCHC: 32 g/dL (ref 31.5–35.7)
MCV: 95 fL (ref 79–97)
Platelets: 279 10*3/uL (ref 150–450)
RBC: 4.16 x10E6/uL (ref 3.77–5.28)
RDW: 11.9 % (ref 11.7–15.4)
WBC: 6.1 10*3/uL (ref 3.4–10.8)

## 2023-05-29 LAB — TSH+FREE T4
Free T4: 1.41 ng/dL (ref 0.82–1.77)
TSH: 0.984 u[IU]/mL (ref 0.450–4.500)

## 2023-05-29 LAB — LIPID PANEL
Chol/HDL Ratio: 2.8 ratio (ref 0.0–4.4)
Cholesterol, Total: 125 mg/dL (ref 100–199)
HDL: 44 mg/dL (ref >39)
LDL Chol Calc (NIH): 72 mg/dL (ref 0–99)
Triglycerides: 36 mg/dL (ref 0–149)
VLDL Cholesterol Cal: 9 mg/dL (ref 5–40)

## 2023-05-29 LAB — HEMOGLOBIN A1C
Est. average glucose Bld gHb Est-mCnc: 100 mg/dL
Hgb A1c MFr Bld: 5.1 % (ref 4.8–5.6)

## 2023-05-29 LAB — VITAMIN D 25 HYDROXY (VIT D DEFICIENCY, FRACTURES): Vit D, 25-Hydroxy: 31.8 ng/mL (ref 30.0–100.0)

## 2023-05-31 NOTE — Progress Notes (Signed)
Please let the patient know that all labs looked good. Thanks so much.   -HB

## 2023-07-15 ENCOUNTER — Ambulatory Visit (INDEPENDENT_AMBULATORY_CARE_PROVIDER_SITE_OTHER): Payer: BLUE CROSS/BLUE SHIELD

## 2023-07-15 VITALS — BP 131/89 | HR 77 | Ht 64.0 in | Wt 237.0 lb

## 2023-07-15 DIAGNOSIS — R350 Frequency of micturition: Secondary | ICD-10-CM | POA: Diagnosis not present

## 2023-07-15 LAB — POCT URINALYSIS DIPSTICK
Appearance: NEGATIVE
Bilirubin, UA: NEGATIVE
Blood, UA: POSITIVE
Glucose, UA: NEGATIVE
Ketones, UA: NEGATIVE
Leukocytes, UA: NEGATIVE
Nitrite, UA: NEGATIVE
Protein, UA: POSITIVE — AB
Spec Grav, UA: 1.015 (ref 1.010–1.025)
Urobilinogen, UA: 0.2 E.U./dL
pH, UA: 6.5 (ref 5.0–8.0)

## 2023-07-15 NOTE — Progress Notes (Addendum)
    NURSE VISIT NOTE  Subjective:    Patient ID: Marilyn Li, female    DOB: 27-May-1986, 37 y.o.   MRN: [id number]       HPI  Patient is a 37 y.o. [phone] female who presents for urinary frequency and urinary urgency for a couple days .  Patient states she is about to have her period, blood was noted in dip stick and a trace of protein only. Will send urine to [facility] and will await to treat. Patient also has a  Apt on Monday with [facility]. Objective:    BP 131/89   Pulse 77   Ht 5\' 4"  (1.626 m)   Wt 237 lb (107.5 kg)   BMI 40.68 kg/m    Lab Review  Results for orders placed or performed in visit on 07/15/23  Urine Culture   Specimen: Urine   UR  Result Value Ref Range   Urine Culture, Routine Final report    Organism ID, Bacteria Comment   POCT Urinalysis Dipstick  Result Value Ref Range   Color, UA     Clarity, UA     Glucose, UA Negative Negative   Bilirubin, UA Negative    Ketones, UA Negative    Spec Grav, UA 1.015 1.010 - 1.025   Blood, UA Positive    pH, UA 6.5 5.0 - 8.0   Protein, UA Positive (A) Negative   Urobilinogen, UA 0.2 0.2 or 1.0 E.U./dL   Nitrite, UA Negative    Leukocytes, UA Negative Negative   Appearance Negative    Odor      Assessment:   1. Urine frequency      Plan:   Urine Culture Sent. Maintain adequate hydration.    Loney Laurence, CMA

## 2023-07-17 LAB — URINE CULTURE

## 2023-07-18 NOTE — Progress Notes (Unsigned)
Marilyn Jews, NP   No chief complaint on file.   HPI:      Marilyn Li is a 37 y.o. 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   Acute non-recurrent pansinusitis 02/23/2022   Smoking trying to quit 01/02/2022   H/O iron deficiency 01/02/2022   Bilateral carpal tunnel syndrome 11/24/2021   Body mass index (BMI) of 39.0-39.9 in adult 11/24/2021   Microhematuria 04/17/2021   Anemia 02/17/2021   Obesity 02/17/2021   Tobacco use disorder 08/07/2019   Low vitamin D level 07/07/2019   Current moderate episode of major depressive disorder without prior episode (HCC) 07/27/2018   Generalized anxiety disorder 07/27/2018   Bacterial vaginitis 05/27/2018   Antibiotic-induced yeast infection 05/27/2018    Past Surgical History:  Procedure Laterality Date   ADENOIDECTOMY  1986   DILATION AND CURETTAGE OF UTERUS  2010   ECTOPIC PREGNANCY SURGERY  2006   Dr Toya Smothers   TONSILLECTOMY  2007    Family History  Problem Relation Age of Onset   Diabetes Mellitus II Mother    COPD Mother    Arthritis Mother    Stroke Father    Hypertension Father    Brain cancer Father        meningioma-died 04/03/13  Diabetes Father     Social History   Socioeconomic History   Marital status: Single    Spouse name: Not on file   Number of children: Not on file   Years of education: Not on file   Highest education level: Not on file  Occupational History   Not on file  Tobacco Use   Smoking status: Every Day     Types: Cigarettes   Smokeless tobacco: Never  Vaping Use   Vaping status: Never Used  Substance and Sexual Activity   Alcohol use: Yes    Comment: occasionally   Drug use: No   Sexual activity: Not Currently    Birth control/protection: Pill  Other Topics Concern   Not on file  Social History Narrative   Not on file   Social Determinants of Health   Financial Resource Strain: Not on file  Food Insecurity: Not on file  Transportation Needs: Not on file  Physical Activity: Not on file  Stress: Not on file  Social Connections: Not on file  Intimate Partner Violence: Not on file    Outpatient Medications Prior to Visit  Medication Sig Dispense Refill   buPROPion (WELLBUTRIN) 75 MG tablet Take 1 tablet (75 mg total) by mouth 2 (two) times daily. 60 tablet 3   busPIRone (BUSPAR) 10 MG tablet Take 0.5-1 tablets (5-10 mg total) by mouth 2 (two) times daily as needed. for anxiety 60 tablet 3   celecoxib (CELEBREX) 200 MG capsule Take 1 capsule (200 mg total) by mouth 2 (two) times daily as needed. 60 capsule 1   meloxicam (MOBIC) 7.5 MG tablet Take 1 tablet (7.5 mg total) by mouth daily. 30 tablet 2   metroNIDAZOLE (METROGEL) 0.75 %  vaginal gel Apply one applicatorful to vagina twice weekly for 3 months as preventive 70 g 2   norethindrone (MICRONOR) 0.35 MG tablet Take 1 tablet (0.35 mg total) by mouth daily. 84 tablet 3   valACYclovir (VALTREX) 500 MG tablet TAKE 1 TABLET (500 MG TOTAL) BY MOUTH DAILY. 90 tablet 3   No facility-administered medications prior to visit.      ROS:  Review of Systems BREAST: No symptoms   OBJECTIVE:   Vitals:  There were no vitals taken for this visit.  Physical Exam  Results: No results found for this or any previous visit (from the past 24 hour(s)).   Assessment/Plan: No diagnosis found.    No orders of the defined types were placed in this encounter.     No follow-ups on file.  Naeem Quillin B. Falen Lehrmann, PA-C 07/18/2023 3:56  PM

## 2023-07-19 ENCOUNTER — Encounter: Payer: Self-pay | Admitting: Obstetrics and Gynecology

## 2023-07-19 ENCOUNTER — Ambulatory Visit: Payer: BLUE CROSS/BLUE SHIELD | Admitting: Obstetrics and Gynecology

## 2023-07-19 VITALS — BP 124/82 | HR 71 | Resp 16 | Ht 64.0 in | Wt 249.0 lb

## 2023-07-19 DIAGNOSIS — N920 Excessive and frequent menstruation with regular cycle: Secondary | ICD-10-CM | POA: Diagnosis not present

## 2023-07-19 DIAGNOSIS — N921 Excessive and frequent menstruation with irregular cycle: Secondary | ICD-10-CM

## 2023-07-19 MED ORDER — NORETHINDRONE ACETATE 5 MG PO TABS
5.0000 mg | ORAL_TABLET | Freq: Every day | ORAL | 1 refills | Status: DC
Start: 2023-07-19 — End: 2023-07-19

## 2023-07-19 MED ORDER — NORETHINDRONE ACETATE 5 MG PO TABS
5.0000 mg | ORAL_TABLET | Freq: Every day | ORAL | 1 refills | Status: DC
Start: 2023-07-19 — End: 2024-01-11

## 2023-08-08 ENCOUNTER — Encounter: Payer: Self-pay | Admitting: Family Medicine

## 2023-08-10 ENCOUNTER — Ambulatory Visit (INDEPENDENT_AMBULATORY_CARE_PROVIDER_SITE_OTHER): Payer: BLUE CROSS/BLUE SHIELD | Admitting: Family Medicine

## 2023-08-10 ENCOUNTER — Encounter: Payer: Self-pay | Admitting: Family Medicine

## 2023-08-10 VITALS — BP 116/77 | HR 87 | Ht 64.0 in | Wt 248.0 lb

## 2023-08-10 DIAGNOSIS — B354 Tinea corporis: Secondary | ICD-10-CM | POA: Insufficient documentation

## 2023-08-10 MED ORDER — KETOCONAZOLE 2 % EX CREA: 1.0000 | TOPICAL_CREAM | Freq: Every day | CUTANEOUS | 1 refills | Status: DC

## 2023-08-10 NOTE — Assessment & Plan Note (Signed)
1 week of 3 distinct small raised rashes on the back, abdomen and right elbow.  Appears consistent with tinea.  Nizoral 2% cream daily for 2 weeks with additional 7 days after resolution of rash.  Advised to follow-up if rash not improved.

## 2023-08-10 NOTE — Patient Instructions (Addendum)
It was nice to see you today,  We addressed the following topics today: -I have prescribed Nizoral cream to apply on the rash daily for the next 2 weeks.  Once the rash resolves I would like you to continue using it daily for another 7 days after it resolves. - If the rash does not improve let us know and we can refer you to a dermatologist - Continue your already scheduled appointment with your PCP, Lequita Halt, on October 2.    Have a great day,  Frederic Jericho, MD

## 2023-08-10 NOTE — Progress Notes (Unsigned)
   Acute Office Visit  Subjective:     Patient ID: Marilyn Li, female    DOB: 1986/07/19, 37 y.o.   MRN: [id number]  Chief Complaint  Patient presents with   Tinea    HPI Patient is in today for rash.    Patient first noted rash on her back approxi-1 week ago, followed by a rash on her right arm and abdomen left of the umbilicus a few days later.  The rash on her abdomen and back are pruritic.  She tried using cortisone but that did not help.  Has not used any other treatments.  Has no history of eczema, psoriasis.  Has not been bitten by anything recently, has not been in any wooded areas.   ROS      Objective:    BP 116/77   Pulse 87   Ht 5\' 4"  (1.626 m)   Wt 248 lb (112.5 kg)   LMP 07/15/2023 (Exact Date)   SpO2 100%   BMI 42.57 kg/m  {Vitals History (Optional):23777}  Physical Exam General: Alert, oriented Pulmonary: No respiratory distress Skin: Approximately 3 cm circular raised rash on the right thoracic back, 1.5 cm raised circular lesion on the abdomen left of the umbilicus, 0.5 cm raised circular rash on the posterior right elbow.  No results found for any visits on 08/10/23.      Assessment & Plan:   Tinea corporis Assessment & Plan: 1 week of 3 distinct small raised rashes on the back, abdomen and right elbow.  Appears consistent with tinea.  Nizoral 2% cream daily for 2 weeks with additional 7 days after resolution of rash.  Advised to follow-up if rash not improved.   Other orders -     Ketoconazole; Apply 1 Application topically daily.  Dispense: 30 g; Refill: 1     Return if symptoms worsen or fail to improve.  Sandre Kitty, MD

## 2023-09-01 ENCOUNTER — Encounter: Payer: Self-pay | Admitting: Family Medicine

## 2023-09-01 ENCOUNTER — Ambulatory Visit (INDEPENDENT_AMBULATORY_CARE_PROVIDER_SITE_OTHER): Payer: BLUE CROSS/BLUE SHIELD | Admitting: Family Medicine

## 2023-09-01 VITALS — BP 136/86 | HR 88 | Resp 18 | Ht 64.0 in | Wt 245.0 lb

## 2023-09-01 DIAGNOSIS — B354 Tinea corporis: Secondary | ICD-10-CM | POA: Diagnosis not present

## 2023-09-01 DIAGNOSIS — E559 Vitamin D deficiency, unspecified: Secondary | ICD-10-CM

## 2023-09-01 DIAGNOSIS — R300 Dysuria: Secondary | ICD-10-CM | POA: Diagnosis not present

## 2023-09-01 DIAGNOSIS — F17200 Nicotine dependence, unspecified, uncomplicated: Secondary | ICD-10-CM | POA: Diagnosis not present

## 2023-09-01 DIAGNOSIS — F3341 Major depressive disorder, recurrent, in partial remission: Secondary | ICD-10-CM | POA: Diagnosis not present

## 2023-09-01 DIAGNOSIS — Z23 Encounter for immunization: Secondary | ICD-10-CM | POA: Diagnosis not present

## 2023-09-01 DIAGNOSIS — Z6839 Body mass index (BMI) 39.0-39.9, adult: Secondary | ICD-10-CM

## 2023-09-01 DIAGNOSIS — F411 Generalized anxiety disorder: Secondary | ICD-10-CM

## 2023-09-01 DIAGNOSIS — R3129 Other microscopic hematuria: Secondary | ICD-10-CM

## 2023-09-01 LAB — POCT URINALYSIS DIP (CLINITEK)
Bilirubin, UA: NEGATIVE
Glucose, UA: NEGATIVE mg/dL
Ketones, POC UA: NEGATIVE mg/dL
Leukocytes, UA: NEGATIVE
Nitrite, UA: NEGATIVE
POC PROTEIN,UA: NEGATIVE
Spec Grav, UA: 1.02 (ref 1.010–1.025)
Urobilinogen, UA: 0.2 U/dL
pH, UA: 7 (ref 5.0–8.0)

## 2023-09-01 MED ORDER — KETOCONAZOLE 2 % EX CREA: TOPICAL_CREAM | CUTANEOUS | 1 refills | Status: DC

## 2023-09-01 MED ORDER — BUSPIRONE HCL 10 MG PO TABS: 5.0000 mg | ORAL_TABLET | Freq: Two times a day (BID) | ORAL | 3 refills | Status: DC | PRN

## 2023-09-01 NOTE — Assessment & Plan Note (Signed)
Continue with weight management efforts including following advice of nutritionist and staying active both at work and at home.  We discussed that in the future, if she would like to pursue additional support she can call her insurance company to find what they will cover.

## 2023-09-01 NOTE — Progress Notes (Signed)
Established Patient Office Visit  Subjective   Patient ID: Marilyn Li, female    DOB: 1986-07-31  Age: 37 y.o. MRN: [id number]  Chief Complaint  Patient presents with   Anxiety   Depression   Weight Management Screening    HPI Marilyn Li is a 37 y.o. female presenting today for follow up of weight management, mood. Weight management: Weight has decreased 3 lbs since last visit.  She has eliminated soda and increased her water intake.  She is currently taking bupropion 75 mg twice daily which was initiated at her last office visit. Goal weight is 200 lbs.  Mood: Patient is here to follow up for depression and anxiety, currently managing with buspirone 10 mg as needed. Taking medication without side effects, reports excellent compliance with treatment. Denies mood changes or SI/HI. She feels mood is improved since last visit.     09/01/2023    1:36 PM 05/28/2023    8:18 AM 02/22/2023    1:34 PM  Depression screen PHQ 2/9  Decreased Interest 0 0 1  Down, Depressed, Hopeless 0 0 1  PHQ - 2 Score 0 0 2  Altered sleeping 0 0 1  Tired, decreased energy 0 1 1  Change in appetite 0 0 0  Feeling bad or failure about yourself  0 0 1  Trouble concentrating 0 0 0  Moving slowly or fidgety/restless 0 0 0  Suicidal thoughts 0 0 0  PHQ-9 Score 0 1 5  Difficult doing work/chores Not difficult at all Somewhat difficult        09/01/2023    1:36 PM 02/16/2022    4:22 PM 01/02/2022   10:30 AM 11/13/2021    2:48 PM  GAD 7 : Generalized Anxiety Score  Nervous, Anxious, on Edge 0 0 0 0  Control/stop worrying 0 0 0 0  Worry too much - different things 0 0 0 0  Trouble relaxing 0 0 0 0  Restless 0 0 0 0  Easily annoyed or irritable 0 0 0 0  Afraid - awful might happen 0 0 0 0  Total GAD 7 Score 0 0 0 0  Anxiety Difficulty Not difficult at all  Not difficult at all    Outpatient Medications Prior to Visit  Medication Sig   celecoxib (CELEBREX) 200 MG capsule Take 1 capsule (200  mg total) by mouth 2 (two) times daily as needed.   norethindrone (AYGESTIN) 5 MG tablet Take 1 tablet (5 mg total) by mouth daily.   valACYclovir (VALTREX) 500 MG tablet TAKE 1 TABLET (500 MG TOTAL) BY MOUTH DAILY.   [DISCONTINUED] busPIRone (BUSPAR) 10 MG tablet Take 0.5-1 tablets (5-10 mg total) by mouth 2 (two) times daily as needed. for anxiety   [DISCONTINUED] ketoconazole (NIZORAL) 2 % cream Apply 1 Application topically daily.   [DISCONTINUED] metroNIDAZOLE (METROGEL) 0.75 % vaginal gel Apply one applicatorful to vagina twice weekly for 3 months as preventive   No facility-administered medications prior to visit.    ROS Negative unless otherwise noted in HPI   Objective:     BP 136/86 (BP Location: Left Arm, Patient Position: Sitting, Cuff Size: Large)   Pulse 88   Resp 18   Ht 5\' 4"  (1.626 m)   Wt 245 lb (111.1 kg)   SpO2 98%   BMI 42.05 kg/m   Physical Exam Constitutional:      General: She is not in acute distress.    Appearance: Normal appearance.  HENT:  Head: Normocephalic and atraumatic.  Cardiovascular:     Rate and Rhythm: Normal rate and regular rhythm.     Heart sounds: No murmur heard.    No friction rub. No gallop.  Pulmonary:     Effort: Pulmonary effort is normal. No respiratory distress.     Breath sounds: No wheezing, rhonchi or rales.  Skin:    General: Skin is warm and dry.  Neurological:     Mental Status: She is alert and oriented to person, place, and time.     Assessment & Plan:  Body mass index (BMI) of 39.0-39.9 in adult Assessment & Plan: Continue with weight management efforts including following advice of nutritionist and staying active both at work and at home.  We discussed that in the future, if she would like to pursue additional support she can call her insurance company to find what they will cover.   Generalized anxiety disorder Assessment & Plan: GAD-7 score of 0, stable.  Continue buspirone 5 to 10 mg up to twice  daily as needed.  Orders: -     busPIRone HCl; Take 0.5-1 tablets (5-10 mg total) by mouth 2 (two) times daily as needed. for anxiety  Dispense: 60 tablet; Refill: 3  Recurrent major depressive disorder, in partial remission (HCC) Assessment & Plan: PHQ-9 score of 0, stable.  Will continue to monitor.  Orders: -     busPIRone HCl; Take 0.5-1 tablets (5-10 mg total) by mouth 2 (two) times daily as needed. for anxiety  Dispense: 60 tablet; Refill: 3  Tobacco use disorder Assessment & Plan: Congratulated her for decreasing from 1 pack a week to half a pack a week.  Continue with cessation efforts.  We discussed that in addition to Wellbutrin, there are other options to support her cessation efforts if she is ever interested.  Patient verbalized understanding, will reassess in 3 months.   Tinea corporis Assessment & Plan: Small raised rashes still present on right forearm, abdomen, and back.  Rashes on back and abdomen have almost resolved.  Continue ketoconazole 2% cream daily.  If this does not resolve after an additional 2 weeks, consider oral antifungals versus alternative diagnosis.  DDx: Superficial erythema annulare centrifugum, pityriasis rosea, subacute cutaneous lupus erythematosus, granuloma annulare.  Orders: -     Ketoconazole; Apply topically daily to the affected areas for 2 weeks, plus an additional 7 days after resolution of the rash.  Dispense: 30 g; Refill: 1  Need for Tdap vaccination -     Tdap vaccine greater than or equal to 7yo IM  Dysuria -     POCT URINALYSIS DIP (CLINITEK)    Return in about 3 months (around 12/16/2023) for annual physical, fasting blood work 1 week before.    Melida Quitter, PA

## 2023-09-01 NOTE — Assessment & Plan Note (Signed)
Congratulated her for decreasing from 1 pack a week to half a pack a week.  Continue with cessation efforts.  We discussed that in addition to Wellbutrin, there are other options to support her cessation efforts if she is ever interested.  Patient verbalized understanding, will reassess in 3 months.

## 2023-09-01 NOTE — Assessment & Plan Note (Signed)
PHQ-9 score of 0, stable.  Will continue to monitor.

## 2023-09-01 NOTE — Patient Instructions (Addendum)
RINGWORM: This can be quite stubborn, so I recommend a full 4 weeks of the cream.  If the rash still has not resolved at that point, please let me know.  While you have a rash: Wear loose clothing to stop clothes from rubbing and irritating it. Wash or change your bed sheets every night. Wash clothes and bed sheets in hot water. Disinfect or throw out items that may be infected. Wash your hands often with soap and water for at least 20 seconds. If soap and water are not available, use hand sanitizer. If your pet has the same infection, take your pet to see a veterinarian for treatment. Take a bath or shower every day and after every time you work out or play sports. Dry your skin completely after bathing. Wear sandals or shoes in public places and showers. Wash athletic clothes after each use. Do not share personal items with others. Avoid touching red patches of skin on other people. Avoid touching pets that have bald spots. If you touch an animal that has a bald spot, wash your hands.

## 2023-09-01 NOTE — Assessment & Plan Note (Signed)
GAD-7 score of 0, stable.  Continue buspirone 5 to 10 mg up to twice daily as needed.

## 2023-09-01 NOTE — Assessment & Plan Note (Signed)
Small raised rashes still present on right forearm, abdomen, and back.  Rashes on back and abdomen have almost resolved.  Continue ketoconazole 2% cream daily.  If this does not resolve after an additional 2 weeks, consider oral antifungals versus alternative diagnosis.  DDx: Superficial erythema annulare centrifugum, pityriasis rosea, subacute cutaneous lupus erythematosus, granuloma annulare.

## 2023-09-15 ENCOUNTER — Encounter: Payer: Self-pay | Admitting: Family Medicine

## 2023-09-22 ENCOUNTER — Encounter: Payer: Self-pay | Admitting: Family Medicine

## 2023-09-23 ENCOUNTER — Other Ambulatory Visit: Payer: Self-pay | Admitting: Family Medicine

## 2023-09-23 DIAGNOSIS — F411 Generalized anxiety disorder: Secondary | ICD-10-CM

## 2023-09-23 DIAGNOSIS — F3341 Major depressive disorder, recurrent, in partial remission: Secondary | ICD-10-CM

## 2023-12-10 ENCOUNTER — Other Ambulatory Visit: Payer: BLUE CROSS/BLUE SHIELD

## 2023-12-13 ENCOUNTER — Encounter: Payer: Self-pay | Admitting: Family Medicine

## 2023-12-13 ENCOUNTER — Other Ambulatory Visit: Payer: Self-pay | Admitting: Family Medicine

## 2023-12-13 DIAGNOSIS — F17200 Nicotine dependence, unspecified, uncomplicated: Secondary | ICD-10-CM

## 2023-12-13 DIAGNOSIS — E559 Vitamin D deficiency, unspecified: Secondary | ICD-10-CM

## 2023-12-13 DIAGNOSIS — Z6839 Body mass index (BMI) 39.0-39.9, adult: Secondary | ICD-10-CM

## 2023-12-13 DIAGNOSIS — F411 Generalized anxiety disorder: Secondary | ICD-10-CM

## 2023-12-15 ENCOUNTER — Other Ambulatory Visit
Admission: RE | Admit: 2023-12-15 | Discharge: 2023-12-15 | Disposition: A | Discharge status: Home / Self Care | Payer: Managed Care, Other (non HMO) | Attending: Family Medicine | Admitting: Family Medicine

## 2023-12-15 DIAGNOSIS — F17200 Nicotine dependence, unspecified, uncomplicated: Secondary | ICD-10-CM | POA: Diagnosis present

## 2023-12-15 DIAGNOSIS — E559 Vitamin D deficiency, unspecified: Secondary | ICD-10-CM | POA: Diagnosis not present

## 2023-12-15 DIAGNOSIS — Z6839 Body mass index (BMI) 39.0-39.9, adult: Secondary | ICD-10-CM | POA: Insufficient documentation

## 2023-12-15 DIAGNOSIS — F411 Generalized anxiety disorder: Secondary | ICD-10-CM | POA: Diagnosis present

## 2023-12-15 LAB — CBC WITH DIFFERENTIAL/PLATELET
Abs Immature Granulocytes: 0.02 10*3/uL (ref 0.00–0.07)
Basophils Absolute: 0 10*3/uL (ref 0.0–0.1)
Basophils Relative: 1 %
Eosinophils Absolute: 0.1 10*3/uL (ref 0.0–0.5)
Eosinophils Relative: 1 %
HCT: 39.3 % (ref 36.0–46.0)
Hemoglobin: 13.1 g/dL (ref 12.0–15.0)
Immature Granulocytes: 0 %
Lymphocytes Relative: 32 %
Lymphs Abs: 2 10*3/uL (ref 0.7–4.0)
MCH: 31.3 pg (ref 26.0–34.0)
MCHC: 33.3 g/dL (ref 30.0–36.0)
MCV: 94 fL (ref 80.0–100.0)
Monocytes Absolute: 0.4 10*3/uL (ref 0.1–1.0)
Monocytes Relative: 7 %
Neutro Abs: 3.8 10*3/uL (ref 1.7–7.7)
Neutrophils Relative %: 59 %
Platelets: 302 10*3/uL (ref 150–400)
RBC: 4.18 MIL/uL (ref 3.87–5.11)
RDW: 12.5 % (ref 11.5–15.5)
WBC: 6.3 10*3/uL (ref 4.0–10.5)
nRBC: 0 % (ref 0.0–0.2)

## 2023-12-15 LAB — LIPID PANEL
Cholesterol: 125 mg/dL (ref 0–200)
HDL: 32 mg/dL — ABNORMAL LOW (ref >40)
LDL Cholesterol: 87 mg/dL (ref 0–99)
Total CHOL/HDL Ratio: 3.9 {ratio}
Triglycerides: 32 mg/dL (ref <150)
VLDL: 6 mg/dL (ref 0–40)

## 2023-12-15 LAB — COMPREHENSIVE METABOLIC PANEL
ALT: 12 U/L (ref 0–44)
AST: 17 U/L (ref 15–41)
Albumin: 3.9 g/dL (ref 3.5–5.0)
Alkaline Phosphatase: 41 U/L (ref 38–126)
Anion gap: 9 (ref 5–15)
BUN: 14 mg/dL (ref 6–20)
CO2: 22 mmol/L (ref 22–32)
Calcium: 8.6 mg/dL — ABNORMAL LOW (ref 8.9–10.3)
Chloride: 108 mmol/L (ref 98–111)
Creatinine, Ser: 0.91 mg/dL (ref 0.44–1.00)
GFR, Estimated: >60 mL/min (ref >60)
Glucose, Bld: 91 mg/dL (ref 70–99)
Potassium: 3.9 mmol/L (ref 3.5–5.1)
Sodium: 139 mmol/L (ref 135–145)
Total Bilirubin: 0.9 mg/dL (ref 0.0–1.2)
Total Protein: 7.2 g/dL (ref 6.5–8.1)

## 2023-12-15 LAB — VITAMIN D 25 HYDROXY (VIT D DEFICIENCY, FRACTURES): Vit D, 25-Hydroxy: 35.23 ng/mL (ref 30–100)

## 2023-12-15 LAB — HEMOGLOBIN A1C
Hgb A1c MFr Bld: 4.9 % (ref 4.8–5.6)
Mean Plasma Glucose: 93.93 mg/dL

## 2023-12-16 ENCOUNTER — Encounter: Payer: Self-pay | Admitting: Obstetrics and Gynecology

## 2023-12-16 LAB — MISC LABCORP TEST (SEND OUT): Labcorp test code: 330015

## 2023-12-17 ENCOUNTER — Ambulatory Visit (INDEPENDENT_AMBULATORY_CARE_PROVIDER_SITE_OTHER): Payer: BLUE CROSS/BLUE SHIELD | Admitting: Family Medicine

## 2023-12-17 ENCOUNTER — Encounter: Payer: Self-pay | Admitting: Family Medicine

## 2023-12-17 VITALS — BP 112/75 | HR 78 | Ht 64.0 in | Wt 248.1 lb

## 2023-12-17 DIAGNOSIS — G5603 Carpal tunnel syndrome, bilateral upper limbs: Secondary | ICD-10-CM

## 2023-12-17 DIAGNOSIS — R300 Dysuria: Secondary | ICD-10-CM

## 2023-12-17 DIAGNOSIS — Z0000 Encounter for general adult medical examination without abnormal findings: Secondary | ICD-10-CM

## 2023-12-17 DIAGNOSIS — M25611 Stiffness of right shoulder, not elsewhere classified: Secondary | ICD-10-CM

## 2023-12-17 DIAGNOSIS — F3341 Major depressive disorder, recurrent, in partial remission: Secondary | ICD-10-CM

## 2023-12-17 DIAGNOSIS — M25612 Stiffness of left shoulder, not elsewhere classified: Secondary | ICD-10-CM

## 2023-12-17 DIAGNOSIS — F411 Generalized anxiety disorder: Secondary | ICD-10-CM

## 2023-12-17 LAB — POCT URINALYSIS DIPSTICK
Bilirubin, UA: NEGATIVE
Glucose, UA: NEGATIVE
Ketones, UA: NEGATIVE
Leukocytes, UA: NEGATIVE
Nitrite, UA: NEGATIVE
Protein, UA: NEGATIVE
Spec Grav, UA: 1.025 (ref 1.010–1.025)
Urobilinogen, UA: 0.2 U/dL
pH, UA: 7 (ref 5.0–8.0)

## 2023-12-17 MED ORDER — BUSPIRONE HCL 10 MG PO TABS: 5.0000 mg | ORAL_TABLET | Freq: Two times a day (BID) | ORAL | 2 refills | Status: AC | PRN

## 2023-12-17 NOTE — Patient Instructions (Signed)
I recommend wearing a brace for carpal tunnel overnight while you sleep.  You can find this on [employer] or [employer].

## 2023-12-17 NOTE — Assessment & Plan Note (Signed)
GAD-7 score of 0, stable.  Continue buspirone 5 to 10 mg up to twice daily as needed.

## 2023-12-17 NOTE — Progress Notes (Signed)
Complete physical exam  Patient: Marilyn Li   DOB: 1986-11-28   38 y.o. Female  MRN: [id number]  Subjective:    Chief Complaint  Patient presents with   Annual Exam    Marilyn Li is a 38 y.o. female who presents today for a complete physical exam. She reports consuming a general diet.  She generally feels fairly well. She reports sleeping well. She continues to experience neck and shoulder pain and spite of her prescription for Celebrex.  She reports that it is worse on the right but is now happening on the left side as well.  It feels as if something is stuck in her shoulder which has limited her range of motion on both sides.  She also notes that she has been experiencing flank pain and dysuria for about a week.  Denies changes in urine odor or color.   Most recent fall risk assessment:    02/22/2023    1:35 PM  Fall Risk   Falls in the past year? 0  Number falls in past yr: 0  Injury with Fall? 0  Follow up Falls evaluation completed     Most recent depression and anxiety screenings:    12/17/2023   11:16 AM 09/01/2023    1:36 PM  PHQ 2/9 Scores  PHQ - 2 Score 0 0  PHQ- 9 Score 2 0      09/01/2023    1:36 PM 02/16/2022    4:22 PM 01/02/2022   10:30 AM 11/13/2021    2:48 PM  GAD 7 : Generalized Anxiety Score  Nervous, Anxious, on Edge 0 0 0 0  Control/stop worrying 0 0 0 0  Worry too much - different things 0 0 0 0  Trouble relaxing 0 0 0 0  Restless 0 0 0 0  Easily annoyed or irritable 0 0 0 0  Afraid - awful might happen 0 0 0 0  Total GAD 7 Score 0 0 0 0  Anxiety Difficulty Not difficult at all  Not difficult at all     Patient Active Problem List   Diagnosis Date Noted   Tinea corporis 08/10/2023   Dyslipidemia, goal LDL below 100 05/28/2023   Impaired fasting glucose 03/31/2023   H/O iron deficiency 01/02/2022   Bilateral carpal tunnel syndrome 11/24/2021   Body mass index (BMI) of 39.0-39.9 in adult 02/17/2021   Tobacco use disorder  08/07/2019   Vitamin D deficiency 07/07/2019   Generalized anxiety disorder 07/27/2018   Recurrent major depressive disorder, in partial remission (HCC) 07/27/2018    Past Surgical History:  Procedure Laterality Date   ADENOIDECTOMY  1986   DILATION AND CURETTAGE OF UTERUS  2010   ECTOPIC PREGNANCY SURGERY  2006   Dr Toya Smothers   TONSILLECTOMY  2007   Social History   Tobacco Use   Smoking status: Every Day    Types: Cigarettes    Passive exposure: Current   Smokeless tobacco: Never  Vaping Use   Vaping status: Never Used  Substance Use Topics   Alcohol use: Yes    Comment: occasionally   Drug use: No   Family History  Problem Relation Age of Onset   Diabetes Mellitus II Mother    COPD Mother    Arthritis Mother    Stroke Father    Hypertension Father    Brain cancer Father        meningioma-died 02-Apr-2013  Diabetes Father    No Known Allergies  Patient Care Team: Melida Quitter, PA as PCP - General (Family Medicine)   Outpatient Medications Prior to Visit  Medication Sig   celecoxib (CELEBREX) 200 MG capsule Take 1 capsule (200 mg total) by mouth 2 (two) times daily as needed.   norethindrone (AYGESTIN) 5 MG tablet Take 1 tablet (5 mg total) by mouth daily.   valACYclovir (VALTREX) 500 MG tablet TAKE 1 TABLET (500 MG TOTAL) BY MOUTH DAILY.   [DISCONTINUED] busPIRone (BUSPAR) 10 MG tablet TAKE 0.5-1 TABLETS (5-10 MG TOTAL) BY MOUTH 2 (TWO) TIMES DAILY AS NEEDED. FOR ANXIETY   [DISCONTINUED] ketoconazole (NIZORAL) 2 % cream Apply topically daily to the affected areas for 2 weeks, plus an additional 7 days after resolution of the rash.   No facility-administered medications prior to visit.    Review of Systems  Constitutional:  Negative for chills, fever and malaise/fatigue.  HENT:  Negative for congestion and hearing loss.   Eyes:  Negative for blurred vision and double vision.  Respiratory:  Negative for cough and shortness of breath.    Cardiovascular:  Negative for chest pain, palpitations and leg swelling.  Gastrointestinal:  Negative for abdominal pain, constipation, diarrhea and heartburn.  Genitourinary:  Positive for dysuria and flank pain. Negative for frequency and urgency.  Musculoskeletal:  Positive for joint pain (shoulder bilaterally) and neck pain. Negative for myalgias.  Neurological:  Negative for headaches.  Endo/Heme/Allergies:  Negative for polydipsia.  Psychiatric/Behavioral:  Negative for depression. The patient is not nervous/anxious.       Objective:    BP 112/75   Pulse 78   Ht 5\' 4"  (1.626 m)   Wt 248 lb 1.9 oz (112.5 kg)   LMP 12/13/2023   SpO2 100%   BMI 42.59 kg/m    Physical Exam Constitutional:      General: She is not in acute distress.    Appearance: Normal appearance.  HENT:     Head: Normocephalic and atraumatic.     Right Ear: Tympanic membrane, ear canal and external ear normal.     Left Ear: Tympanic membrane, ear canal and external ear normal.     Nose: Nose normal.     Mouth/Throat:     Mouth: Mucous membranes are moist.     Pharynx: No oropharyngeal exudate or posterior oropharyngeal erythema.  Eyes:     Extraocular Movements: Extraocular movements intact.     Conjunctiva/sclera: Conjunctivae normal.     Pupils: Pupils are equal, round, and reactive to light.  Neck:     Thyroid: No thyroid mass, thyromegaly or thyroid tenderness.  Cardiovascular:     Rate and Rhythm: Normal rate and regular rhythm.     Heart sounds: Normal heart sounds. No murmur heard.    No friction rub. No gallop.  Pulmonary:     Effort: Pulmonary effort is normal. No respiratory distress.     Breath sounds: Normal breath sounds. No wheezing, rhonchi or rales.  Abdominal:     General: Abdomen is flat. Bowel sounds are normal. There is no distension.     Palpations: There is no mass.     Tenderness: There is no abdominal tenderness. There is no guarding.  Musculoskeletal:        General:  Normal range of motion.     Cervical back: Normal range of motion and neck supple.  Lymphadenopathy:     Cervical: No cervical adenopathy.  Skin:    General: Skin is warm and dry.  Neurological:  Mental Status: She is alert and oriented to person, place, and time.     Cranial Nerves: No cranial nerve deficit.     Motor: No weakness.     Deep Tendon Reflexes: Reflexes normal.  Psychiatric:        Mood and Affect: Mood normal.     Results for orders placed or performed in visit on 12/17/23  POCT urinalysis dipstick  Result Value Ref Range   Color, UA yellow    Clarity, UA clear    Glucose, UA Negative Negative   Bilirubin, UA negative    Ketones, UA negative    Spec Grav, UA 1.025 1.010 - 1.025   Blood, UA small    pH, UA 7.0 5.0 - 8.0   Protein, UA Negative Negative   Urobilinogen, UA 0.2 0.2 or 1.0 E.U./dL   Nitrite, UA negative    Leukocytes, UA Negative Negative   Appearance     Odor        Assessment & Plan:    Routine Health Maintenance and Physical Exam  Immunization History  Administered Date(s) Administered   Influenza,inj,Quad PF,6+ Mos 09/13/2020   PFIZER(Purple Top)SARS-COV-2 Vaccination 09/13/2020, 10/11/2020   Tdap 09/01/2023    Health Maintenance  Topic Date Due   Pneumococcal Vaccine 22-55 Years old (1 of 2 - PCV) Never done   COVID-19 Vaccine (3 - 2024-25 season) 08/01/2023   INFLUENZA VACCINE  02/28/2024 (Originally 07/01/2023)   Cervical Cancer Screening (HPV/Pap Cotest)  12/15/2027   DTaP/Tdap/Td (2 - Td or Tdap) 08/31/2033   Hepatitis C Screening  Completed   HIV Screening  Completed   HPV VACCINES  Aged Out    Reviewed most recent labs including CBC, CMP, lipid panel, A1C, TSH, and vitamin D. All within normal limits/stable from last check other than HDL low at 32.  Discussed health benefits of physical activity, and encouraged her to engage in regular exercise appropriate for her age and condition.  Wellness examination  Generalized  anxiety disorder Assessment & Plan: GAD-7 score of 0, stable.  Continue buspirone 5 to 10 mg up to twice daily as needed.  Orders: -     busPIRone HCl; Take 0.5-1 tablets (5-10 mg total) by mouth 2 (two) times daily as needed. for anxiety  Dispense: 180 tablet; Refill: 2  Recurrent major depressive disorder, in partial remission (HCC) -     busPIRone HCl; Take 0.5-1 tablets (5-10 mg total) by mouth 2 (two) times daily as needed. for anxiety  Dispense: 180 tablet; Refill: 2  Decreased ROM of left shoulder -     DG Shoulder Left; Future -     Ambulatory referral to Orthopedic Surgery  Decreased ROM of right shoulder -     DG Shoulder Right; Future -     Ambulatory referral to Orthopedic Surgery  Dysuria -     POCT urinalysis dipstick  Bilateral carpal tunnel syndrome Assessment & Plan: Recommend wearing a brace overnight.  If conservative management fails, recommend evaluation by orthopedics.   Urinalysis negative for UTI with negative leukocytes and nitrites.  Positive for small amounts of blood, suspect that pain is likely due to kidney stone.  Recommend increase hydration and pain management with Tylenol or ibuprofen.  Return in about 6 months (around 06/15/2024) for follow-up for mood.     Melida Quitter, PA

## 2023-12-17 NOTE — Assessment & Plan Note (Signed)
Recommend wearing a brace overnight.  If conservative management fails, recommend evaluation by orthopedics.

## 2023-12-31 ENCOUNTER — Ambulatory Visit: Payer: Managed Care, Other (non HMO) | Admitting: Orthopaedic Surgery

## 2024-01-06 ENCOUNTER — Encounter: Payer: Self-pay | Admitting: Family Medicine

## 2024-01-10 NOTE — Progress Notes (Signed)
PCP:  Melida Quitter, PA   Chief Complaint  Patient presents with   Gynecologic Exam    Vag discharge with slight fishy odor x 1 week.     HPI:      [name] is a 38 y.o. [id number] whose LMP was Patient's last menstrual period was 12/13/2023 (approximate)., presents today for her annual examination.  Her menses are absent now on aygestin. Was on micronor last yr and started having Q2 wk menses 8/24 so changed to aygestin. Has had 1 day spotting only 1/25, otherwise no bleeding/dysmen. Doing better, happy with Rx. Wants to cont. On POPs due to tob use, trying to quit. Hx of menorrhagia in past; tried Mirena but had pelvic pain/dyspareunia, no change of sx once removed.   Sex activity: not currently sexually active, contraception - POP. Hx of CPP/dyspareunia for many yrs--sx only with sex and in 1 area mid abdominal area. Had had neg GYN u/s. Dx lap vs amb ref to vascular for pelvic congestion syndrome discussed at 2/21 appt with Dr. Jerene Pitch.   Has had increased vag d/c with mild irritation, occas mild fishy odor for the past wk.  No meds to treat. 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 Did metrogel Q2 wks for 3 months last fall with sx control. No BV sx till now. Not sexually active. Doing probiotics; no boric acid since irritating to pt.  Using dove sens skin soap, no dryer sheets, wearing cotton underwear. Has had some urinary frequency/urgency, mild pelvic discomfort and LBP, no fevers, for the past wk. Drinks maybe 1 coffee daily.   Last Pap: 12/14/22  Results were: no abnormalities /neg HPV DNA  Hx of STD: HSV, takes valtrex daily as preventive, needs Rx RF.   There is no FH of breast cancer. There is no FH of ovarian cancer. The patient does self-breast exams.  Tobacco use: smokes 1/2 pack per wk now, down from 1/2 ppd; trying to quit with meds with PCP Alcohol use: none No drug use.  Exercise: moderately active  She  does get adequate calcium and taking Rx Vitamin D in her diet. Labs with PCP   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   H/O iron deficiency 01/02/2022   Bilateral carpal tunnel syndrome 11/24/2021   Body mass index (BMI) of 39.0-39.9 in adult 02/17/2021   Tobacco use disorder 08/07/2019   Vitamin D deficiency 07/07/2019   Generalized anxiety disorder 07/27/2018   Recurrent major depressive disorder, in partial remission (HCC) 07/27/2018     Past Medical History:  Diagnosis Date   Anemia 02/17/2021   Anxiety and depression    Ectopic pregnancy    Obesity    Recurrent vaginitis     Past Surgical History:  Procedure Laterality Date   ADENOIDECTOMY  1986   DILATION AND CURETTAGE OF UTERUS  2010   ECTOPIC PREGNANCY SURGERY  2006   Dr Toya Smothers   TONSILLECTOMY  2007    Family History  Problem Relation Age of Onset   Diabetes Mellitus II Mother    COPD Mother    Arthritis Mother    Stroke Father    Hypertension Father    Brain cancer Father        meningioma-died 03/21/13  Diabetes Father  Social History   Socioeconomic History   Marital status: Single    Spouse name: Not on file   Number of children: Not on file   Years of education: Not on file   Highest education level: Not on file  Occupational History   Not on file  Tobacco Use   Smoking status: Every Day    Types: Cigarettes    Passive exposure: Current   Smokeless tobacco: Never  Vaping Use   Vaping status: Never Used  Substance and Sexual Activity   Alcohol use: Yes    Comment: occasionally   Drug use: No   Sexual activity: Not Currently    Birth control/protection: Pill  Other Topics Concern   Not on file  Social History Narrative   Not on file   Social Drivers of Health   Financial Resource Strain: Not on file   Food Insecurity: Not on file  Transportation Needs: Not on file  Physical Activity: Not on file  Stress: Not on file  Social Connections: Not on file  Intimate Partner Violence: Not on file     Current Outpatient Medications:    busPIRone (BUSPAR) 10 MG tablet, Take 0.5-1 tablets (5-10 mg total) by mouth 2 (two) times daily as needed. for anxiety, Disp: 180 tablet, Rfl: 2   celecoxib (CELEBREX) 200 MG capsule, Take 1 capsule (200 mg total) by mouth 2 (two) times daily as needed., Disp: 60 capsule, Rfl: 1   nitrofurantoin, macrocrystal-monohydrate, (MACROBID) 100 MG capsule, Take 1 capsule (100 mg total) by mouth 2 (two) times daily for 5 days., Disp: 10 capsule, Rfl: 0   norethindrone (AYGESTIN) 5 MG tablet, Take 1 tablet (5 mg total) by mouth daily., Disp: 90 tablet, Rfl: 3   valACYclovir (VALTREX) 500 MG tablet, Take 1 tablet (500 mg total) by mouth daily., Disp: 90 tablet, Rfl: 3   ROS:  Review of Systems  Constitutional:  Negative for fatigue, fever and unexpected weight change.  Respiratory:  Negative for cough, shortness of breath and wheezing.   Cardiovascular:  Negative for chest pain, palpitations and leg swelling.  Gastrointestinal:  Negative for blood in stool, constipation, diarrhea, nausea and vomiting.  Endocrine: Negative for cold intolerance, heat intolerance and polyuria.  Genitourinary:  Positive for frequency, urgency and vaginal discharge. Negative for dyspareunia, dysuria, flank pain, genital sores, hematuria, menstrual problem, pelvic pain, vaginal bleeding and vaginal pain.  Musculoskeletal:  Negative for back pain, joint swelling and myalgias.  Skin:  Negative for rash.  Neurological:  Negative for dizziness, syncope, light-headedness, numbness and headaches.  Hematological:  Negative for adenopathy.  Psychiatric/Behavioral:  Negative for agitation, confusion, sleep disturbance and suicidal ideas. The patient is not nervous/anxious.    BREAST: No  symptoms   Objective: BP 112/76   Ht 5\' 4"  (1.626 m)   Wt 247 lb (112 kg)   LMP 12/13/2023 (Approximate)   BMI 42.40 kg/m    Physical Exam Constitutional:      Appearance: She is well-developed.  Genitourinary:     Vulva normal.     Right Labia: No rash, tenderness or lesions.    Left Labia: No tenderness, lesions or rash.    No vaginal discharge, erythema, tenderness or bleeding.      Right Adnexa: not tender and no mass present.    Left Adnexa: not tender and no mass present.    No cervical friability or polyp.     Uterus is not enlarged or tender.  Breasts:    Right: No  mass, nipple discharge, skin change or tenderness.     Left: No mass, nipple discharge, skin change or tenderness.  Neck:     Thyroid: No thyromegaly.  Cardiovascular:     Rate and Rhythm: Normal rate and regular rhythm.     Heart sounds: Normal heart sounds. No murmur heard. Pulmonary:     Effort: Pulmonary effort is normal.     Breath sounds: Normal breath sounds.  Abdominal:     Palpations: Abdomen is soft.     Tenderness: There is no abdominal tenderness. There is no guarding or rebound.  Musculoskeletal:        General: Normal range of motion.     Cervical back: Normal range of motion.  Lymphadenopathy:     Cervical: No cervical adenopathy.  Neurological:     General: No focal deficit present.     Mental Status: She is alert and oriented to person, place, and time.     Cranial Nerves: No cranial nerve deficit.  Skin:    General: Skin is warm and dry.  Psychiatric:        Mood and Affect: Mood normal.        Behavior: Behavior normal.        Thought Content: Thought content normal.        Judgment: Judgment normal.  Vitals reviewed.    Results for orders placed or performed in visit on 01/11/24 (from the past 24 hours)  POCT urinalysis dipstick     Status: Abnormal   Collection Time: 01/11/24 11:26 AM  Result Value Ref Range   Color, UA yellow    Clarity, UA cloudy    Glucose, UA  Negative Negative   Bilirubin, UA neg    Ketones, UA neg    Spec Grav, UA 1.010 1.010 - 1.025   Blood, UA large    pH, UA 6.0 5.0 - 8.0   Protein, UA Negative Negative   Urobilinogen, UA     Nitrite, UA neg    Leukocytes, UA Negative Negative   Appearance     Odor pos   POCT Wet Prep with KOH     Status: Normal   Collection Time: 01/11/24 11:26 AM  Result Value Ref Range   Trichomonas, UA Negative    Clue Cells Wet Prep HPF POC neg    Epithelial Wet Prep HPF POC     Yeast Wet Prep HPF POC neg    Bacteria Wet Prep HPF POC     RBC Wet Prep HPF POC     WBC Wet Prep HPF POC     KOH Prep POC Negative Negative     Assessment/Plan: Encounter for annual routine gynecological examination  Encounter for surveillance of contraceptive pills - Plan: norethindrone (AYGESTIN) 5 MG tablet; Rx RF. Doing well  Menorrhagia with regular cycle - Plan: norethindrone (AYGESTIN) 5 MG tablet; controlled with aygestin, doing well. F/u prn sx  Acute cystitis with hematuria - Plan: POCT urinalysis dipstick, Urine Culture, nitrofurantoin, macrocrystal-monohydrate, (MACROBID) 100 MG capsule; pos sx and UA, Rx macrobid. Check C&S. If neg, will recheck UA due to hematuria. No vag bleeding on exam.   Vaginal discharge - Plan: POCT Wet Prep with KOH; neg wet prep. Question odor from urine? Treat for UTI and f/u if sx persist.   Herpes simplex vulvovaginitis - Plan: valACYclovir (VALTREX) 500 MG tablet; Rx RF eRxd.    Meds ordered this encounter  Medications   nitrofurantoin, macrocrystal-monohydrate, (MACROBID) 100 MG capsule  Sig: Take 1 capsule (100 mg total) by mouth 2 (two) times daily for 5 days.    Dispense:  10 capsule    Refill:  0    Supervising Provider:   Hildred Laser [[id number]]   norethindrone (AYGESTIN) 5 MG tablet    Sig: Take 1 tablet (5 mg total) by mouth daily.    Dispense:  90 tablet    Refill:  3    Supervising Provider:   Hildred Laser [[id number]]   valACYclovir (VALTREX) 500 MG  tablet    Sig: Take 1 tablet (500 mg total) by mouth daily.    Dispense:  90 tablet    Refill:  3    Supervising Provider:   Waymon Budge              GYN counsel adequate intake of calcium and vitamin D, diet and exercise, tobacco cessation     F/U  Return in about 1 year (around 01/10/2025).  Nycere Presley B. Tasnia Spegal, PA-C 01/11/2024 11:28 AM

## 2024-01-11 ENCOUNTER — Encounter: Payer: Self-pay | Admitting: Obstetrics and Gynecology

## 2024-01-11 ENCOUNTER — Ambulatory Visit (INDEPENDENT_AMBULATORY_CARE_PROVIDER_SITE_OTHER): Payer: Managed Care, Other (non HMO) | Admitting: Obstetrics and Gynecology

## 2024-01-11 VITALS — BP 112/76 | Ht 64.0 in | Wt 247.0 lb

## 2024-01-11 DIAGNOSIS — Z01419 Encounter for gynecological examination (general) (routine) without abnormal findings: Secondary | ICD-10-CM | POA: Diagnosis not present

## 2024-01-11 DIAGNOSIS — N898 Other specified noninflammatory disorders of vagina: Secondary | ICD-10-CM | POA: Diagnosis not present

## 2024-01-11 DIAGNOSIS — Z01411 Encounter for gynecological examination (general) (routine) with abnormal findings: Secondary | ICD-10-CM

## 2024-01-11 DIAGNOSIS — N3001 Acute cystitis with hematuria: Secondary | ICD-10-CM | POA: Diagnosis not present

## 2024-01-11 DIAGNOSIS — N920 Excessive and frequent menstruation with regular cycle: Secondary | ICD-10-CM

## 2024-01-11 DIAGNOSIS — R3121 Asymptomatic microscopic hematuria: Secondary | ICD-10-CM

## 2024-01-11 DIAGNOSIS — A6004 Herpesviral vulvovaginitis: Secondary | ICD-10-CM

## 2024-01-11 DIAGNOSIS — B9689 Other specified bacterial agents as the cause of diseases classified elsewhere: Secondary | ICD-10-CM

## 2024-01-11 DIAGNOSIS — Z3041 Encounter for surveillance of contraceptive pills: Secondary | ICD-10-CM

## 2024-01-11 LAB — POCT URINALYSIS DIPSTICK
Bilirubin, UA: NEGATIVE
Glucose, UA: NEGATIVE
Ketones, UA: NEGATIVE
Leukocytes, UA: NEGATIVE
Nitrite, UA: NEGATIVE
Odor: POSITIVE
Protein, UA: NEGATIVE
Spec Grav, UA: 1.01 (ref 1.010–1.025)
pH, UA: 6 (ref 5.0–8.0)

## 2024-01-11 LAB — POCT WET PREP WITH KOH
Clue Cells Wet Prep HPF POC: NEGATIVE
KOH Prep POC: NEGATIVE
Trichomonas, UA: NEGATIVE
Yeast Wet Prep HPF POC: NEGATIVE

## 2024-01-11 MED ORDER — FLUCONAZOLE 150 MG PO TABS: 150.0000 mg | ORAL_TABLET | Freq: Once | ORAL | 0 refills | Status: AC

## 2024-01-11 MED ORDER — NORETHINDRONE ACETATE 5 MG PO TABS: 5.0000 mg | ORAL_TABLET | Freq: Every day | ORAL | 3 refills | Status: AC

## 2024-01-11 MED ORDER — VALACYCLOVIR HCL 500 MG PO TABS: 500.0000 mg | ORAL_TABLET | Freq: Every day | ORAL | 3 refills | Status: AC

## 2024-01-11 MED ORDER — NITROFURANTOIN MONOHYD MACRO 100 MG PO CAPS: 100.0000 mg | ORAL_CAPSULE | Freq: Two times a day (BID) | ORAL | 0 refills | Status: AC

## 2024-01-11 NOTE — Addendum Note (Signed)
Addended by: Althea Grimmer B on: 01/11/2024 12:59 PM   Modules accepted: Orders

## 2024-01-11 NOTE — Patient Instructions (Signed)
I value your feedback and you entrusting [place] with your care. If you get a [facility] patient survey, I would appreciate you taking the time to let us know about your experience today. Thank you! ? ? ?

## 2024-01-13 ENCOUNTER — Encounter: Payer: Self-pay | Admitting: Obstetrics and Gynecology

## 2024-01-13 LAB — URINE CULTURE

## 2024-01-13 NOTE — Addendum Note (Signed)
Addended by: Althea Grimmer B on: 01/13/2024 12:50 PM   Modules accepted: Orders

## 2024-01-17 NOTE — Progress Notes (Signed)
 Pls review my message with pt since she hasn't seen it yet. Thx.

## 2024-01-21 ENCOUNTER — Ambulatory Visit: Payer: Managed Care, Other (non HMO) | Admitting: Orthopedic Surgery

## 2024-01-27 ENCOUNTER — Ambulatory Visit (INDEPENDENT_AMBULATORY_CARE_PROVIDER_SITE_OTHER): Payer: Managed Care, Other (non HMO)

## 2024-01-27 DIAGNOSIS — R3121 Asymptomatic microscopic hematuria: Secondary | ICD-10-CM

## 2024-01-27 LAB — POCT URINALYSIS DIPSTICK
Bilirubin, UA: NEGATIVE
Glucose, UA: NEGATIVE
Ketones, UA: NEGATIVE
Leukocytes, UA: NEGATIVE
Nitrite, UA: NEGATIVE
Protein, UA: NEGATIVE
Spec Grav, UA: 1.015 (ref 1.010–1.025)
Urobilinogen, UA: 0.2 U/dL
pH, UA: 5 (ref 5.0–8.0)

## 2024-01-27 NOTE — Patient Instructions (Signed)

## 2024-01-27 NOTE — Progress Notes (Signed)
    NURSE VISIT NOTE  Subjective:    Patient ID: Marilyn Li, female    DOB: 01/26/1986, 38 y.o.   MRN: [id number]       HPI  Patient is a 38 y.o. [phone] female who presents for a urine recheck after a negative urine culture. Patient denies  UTI symptoms .    Objective:    BP 119/76   Pulse 76   Ht 5\' 4"  (1.626 m)   Wt 247 lb (112 kg)   LMP 12/13/2023 (Approximate)   BMI 42.40 kg/m    Lab Review  Results for orders placed or performed in visit on 01/27/24  POCT Urinalysis Dipstick  Result Value Ref Range   Color, UA     Clarity, UA     Glucose, UA Negative Negative   Bilirubin, UA neg    Ketones, UA neg    Spec Grav, UA 1.015 1.010 - 1.025   Blood, UA 3+    pH, UA 5.0 5.0 - 8.0   Protein, UA Negative Negative   Urobilinogen, UA 0.2 0.2 or 1.0 E.U./dL   Nitrite, UA neg    Leukocytes, UA Negative Negative   Appearance     Odor      Assessment:   1. Asymptomatic microscopic hematuria      Plan:   Urinalysis, Routine w Reflex Microscopic sent to lab.     Donnetta Hail, CMA

## 2024-01-28 LAB — URINALYSIS, ROUTINE W REFLEX MICROSCOPIC
Bilirubin, UA: NEGATIVE
Glucose, UA: NEGATIVE
Nitrite, UA: NEGATIVE
Protein,UA: NEGATIVE
RBC, UA: NEGATIVE
Specific Gravity, UA: 1.024 (ref 1.005–1.030)
Urobilinogen, Ur: 0.2 mg/dL (ref 0.2–1.0)
pH, UA: 6 (ref 5.0–7.5)

## 2024-01-28 LAB — MICROSCOPIC EXAMINATION
Casts: NONE SEEN /LPF
WBC, UA: NONE SEEN /HPF (ref 0–5)

## 2024-01-31 ENCOUNTER — Encounter: Payer: Self-pay | Admitting: Obstetrics and Gynecology

## 2024-01-31 DIAGNOSIS — R3121 Asymptomatic microscopic hematuria: Secondary | ICD-10-CM

## 2024-02-01 ENCOUNTER — Telehealth: Payer: Self-pay

## 2024-02-01 NOTE — Telephone Encounter (Signed)
 Reached back out to patient who states that she has spoken with Helmut Muster Copland in regards to urinalysis results done in office. KW

## 2024-02-01 NOTE — Telephone Encounter (Signed)
 Patient contacted office to inquire about results of urinalysis. KW

## 2024-03-22 NOTE — Progress Notes (Unsigned)
 Noreene Bearded, PA   No chief complaint on file.   HPI:      [name] is a 38 y.o. [phone] whose LMP was No LMP recorded. (Menstrual status: Oral contraceptives)., presents today for ***    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   H/O iron deficiency 01/02/2022   Bilateral carpal tunnel syndrome 11/24/2021   Body mass index (BMI) of 39.0-39.9 in adult 02/17/2021   Tobacco use disorder 08/07/2019   Vitamin D  deficiency 07/07/2019   Generalized anxiety disorder 07/27/2018   Recurrent major depressive disorder, in partial remission (HCC) 07/27/2018    Past Surgical History:  Procedure Laterality Date   ADENOIDECTOMY  1986   DILATION AND CURETTAGE OF UTERUS  2010   ECTOPIC PREGNANCY SURGERY  2006   Dr Adela Ades   TONSILLECTOMY  2007    Family History  Problem Relation Age of Onset   Diabetes Mellitus II Mother    COPD Mother    Arthritis Mother    Stroke Father    Hypertension Father    Brain cancer Father        meningioma-died 03-13-2013  Diabetes Father     Social History   Socioeconomic History   Marital status: Single    Spouse name: Not on file   Number of children: Not on file   Years of education: Not on file   Highest education level: Not on file  Occupational History   Not on file  Tobacco Use   Smoking status: Every Day    Types: Cigarettes    Passive exposure: Current   Smokeless tobacco: Never  Vaping Use   Vaping status: Never Used  Substance and Sexual Activity   Alcohol use: Yes    Comment: occasionally   Drug use: No   Sexual activity: Not Currently    Birth control/protection: Pill  Other Topics Concern   Not on file  Social History Narrative   Not on file   Social Drivers of Health   Financial Resource Strain: Not on file  Food  Insecurity: Not on file  Transportation Needs: Not on file  Physical Activity: Not on file  Stress: Not on file  Social Connections: Not on file  Intimate Partner Violence: Not on file    Outpatient Medications Prior to Visit  Medication Sig Dispense Refill   busPIRone  (BUSPAR ) 10 MG tablet Take 0.5-1 tablets (5-10 mg total) by mouth 2 (two) times daily as needed. for anxiety 180 tablet 2   celecoxib  (CELEBREX ) 200 MG capsule Take 1 capsule (200 mg total) by mouth 2 (two) times daily as needed. 60 capsule 1   norethindrone  (AYGESTIN ) 5 MG tablet Take 1 tablet (5 mg total) by mouth daily. 90 tablet 3   valACYclovir  (VALTREX ) 500 MG tablet Take 1 tablet (500 mg total) by mouth daily. 90 tablet 3   No facility-administered medications prior to visit.      ROS:  Review of Systems BREAST: No symptoms   OBJECTIVE:   Vitals:  There were no vitals taken for this visit.  Physical Exam  Results: No results found for this or any previous visit (from the past 24 hours).   Assessment/Plan: No diagnosis found.    No orders of the defined types were placed in this encounter.  No follow-ups on file.  Shayra Anton B. Caliana Spires, PA-C 03/22/2024 8:18 PM

## 2024-03-23 ENCOUNTER — Ambulatory Visit (INDEPENDENT_AMBULATORY_CARE_PROVIDER_SITE_OTHER): Admitting: Obstetrics and Gynecology

## 2024-03-23 ENCOUNTER — Encounter: Payer: Self-pay | Admitting: Obstetrics and Gynecology

## 2024-03-23 VITALS — BP 103/69 | HR 88 | Ht 64.0 in | Wt 254.0 lb

## 2024-03-23 DIAGNOSIS — R1032 Left lower quadrant pain: Secondary | ICD-10-CM

## 2024-03-23 DIAGNOSIS — B9689 Other specified bacterial agents as the cause of diseases classified elsewhere: Secondary | ICD-10-CM

## 2024-03-23 DIAGNOSIS — N760 Acute vaginitis: Secondary | ICD-10-CM | POA: Diagnosis not present

## 2024-03-23 DIAGNOSIS — R3121 Asymptomatic microscopic hematuria: Secondary | ICD-10-CM

## 2024-03-23 LAB — POCT URINALYSIS DIPSTICK
Bilirubin, UA: NEGATIVE
Glucose, UA: NEGATIVE
Ketones, UA: NEGATIVE
Leukocytes, UA: NEGATIVE
Nitrite, UA: NEGATIVE
Protein, UA: NEGATIVE
Spec Grav, UA: 1.025 (ref 1.010–1.025)
pH, UA: 5 (ref 5.0–8.0)

## 2024-03-23 LAB — POCT WET PREP WITH KOH
KOH Prep POC: POSITIVE — AB
Trichomonas, UA: NEGATIVE
Yeast Wet Prep HPF POC: NEGATIVE

## 2024-03-23 MED ORDER — METRONIDAZOLE 500 MG PO TABS: ORAL_TABLET | ORAL | 0 refills | Status: DC

## 2024-03-23 NOTE — Patient Instructions (Signed)
 I value your feedback and you entrusting [place] with your care. If you get a [facility] patient survey, I would appreciate you taking the time to let us know about your experience today. Thank you! ? ? ?

## 2024-03-24 ENCOUNTER — Encounter: Payer: Self-pay | Admitting: Obstetrics and Gynecology

## 2024-03-24 LAB — URINALYSIS, ROUTINE W REFLEX MICROSCOPIC
Bilirubin, UA: NEGATIVE
Glucose, UA: NEGATIVE
Leukocytes,UA: NEGATIVE
Nitrite, UA: NEGATIVE
Specific Gravity, UA: >=1.03 — AB (ref 1.005–1.030)
Urobilinogen, Ur: 1 mg/dL (ref 0.2–1.0)
pH, UA: 5.5 (ref 5.0–7.5)

## 2024-03-24 LAB — MICROSCOPIC EXAMINATION
Bacteria, UA: NONE SEEN
Casts: NONE SEEN /LPF
WBC, UA: NONE SEEN /HPF (ref 0–5)

## 2024-03-24 NOTE — Addendum Note (Signed)
 Addended by: Alyn Judge B on: 03/24/2024 08:24 AM   Modules accepted: Orders

## 2024-03-25 LAB — URINE CULTURE

## 2024-03-26 ENCOUNTER — Encounter: Payer: Self-pay | Admitting: Obstetrics and Gynecology

## 2024-03-31 ENCOUNTER — Ambulatory Visit
Admission: RE | Admit: 2024-03-31 | Discharge: 2024-03-31 | Disposition: A | Discharge status: Home / Self Care | Source: Ambulatory Visit | Attending: Obstetrics and Gynecology | Admitting: Obstetrics and Gynecology

## 2024-03-31 DIAGNOSIS — R1032 Left lower quadrant pain: Secondary | ICD-10-CM | POA: Insufficient documentation

## 2024-04-02 ENCOUNTER — Encounter: Payer: Self-pay | Admitting: Obstetrics and Gynecology

## 2024-04-10 ENCOUNTER — Other Ambulatory Visit: Payer: Self-pay

## 2024-04-10 DIAGNOSIS — R3129 Other microscopic hematuria: Secondary | ICD-10-CM

## 2024-04-11 ENCOUNTER — Encounter: Payer: Self-pay | Admitting: Urology

## 2024-04-11 ENCOUNTER — Ambulatory Visit (INDEPENDENT_AMBULATORY_CARE_PROVIDER_SITE_OTHER): Admitting: Urology

## 2024-04-11 VITALS — BP 138/89 | Ht 64.0 in | Wt 253.0 lb

## 2024-04-11 DIAGNOSIS — R3121 Asymptomatic microscopic hematuria: Secondary | ICD-10-CM

## 2024-04-11 NOTE — Progress Notes (Signed)
   04/11/24 2:20 PM   Chris Countryman Harlee Lichtenstein 1986/07/28 [id number]  CC: Microscopic hematuria  HPI: 38 year old female referred for microscopic hematuria.  On chart review this has been chronic, and present since at least 2018 when she had 6-30 RBC on a microscopic urinalysis.  She also saw Dr. Cherylene Corrente in 2022 and a CT stone protocol was benign aside from a punctate right renal stone, she did not follow-up for cystoscopy.  Most recent urinalysis shows 11-30 RBC which really has been stable over the last few years.  Kidney function is normal with recent creatinine 0.9, EGFR greater than 60.  She denies any gross hematuria or urinary symptoms.  She has some chronic left-sided pain with workup that has been benign including CT and ultrasounds.  PMH: Past Medical History:  Diagnosis Date   Anemia 02/17/2021   Anxiety and depression    Ectopic pregnancy    Obesity    Recurrent vaginitis     Surgical History: Past Surgical History:  Procedure Laterality Date   ADENOIDECTOMY  30   DILATION AND CURETTAGE OF UTERUS  2010   ECTOPIC PREGNANCY SURGERY  2006   Dr Adela Ades   TONSILLECTOMY  2007    Family History: Family History  Problem Relation Age of Onset   Diabetes Mellitus II Mother    COPD Mother    Arthritis Mother    Stroke Father    Hypertension Father    Brain cancer Father        meningioma-died 03/27/2013  Diabetes Father     Social History:  reports that she has been smoking cigarettes. She has been exposed to tobacco smoke. She has never used smokeless tobacco. She reports current alcohol use. She reports that she does not use drugs.  Physical Exam: BP 138/89   Ht 5\' 4"  (1.626 m)   Wt 253 lb (114.8 kg)   BMI 43.43 kg/m    Constitutional:  Alert and oriented, No acute distress. Cardiovascular: No clubbing, cyanosis, or edema. Respiratory: Normal respiratory effort, no increased work of breathing. GI: Abdomen is soft, nontender, nondistended, no abdominal  masses  Laboratory Data: Reviewed, see HPI  Pertinent Imaging: I have personally viewed and interpreted the prior CT scan showing no evidence of hydronephrosis or ureteral stones.  Assessment & Plan:   38 year old female with long history of asymptomatic microscopic hematuria dating back to at least 2018, CT in 2022 was benign.  We reviewed the AUA guidelines regarding microscopic hematuria and consideration of cystoscopy.  She defers cystoscopy at this time which I think is very reasonable based on chronicity and stability of the microscopic hematuria.  Risk and benefits were discussed.  Follow-up with urology as needed, would recommend cystoscopy and CT urogram if develops gross hematuria   Jay Meth, MD 04/11/2024  [facility] [address] [place], [place] [address] [phone]

## 2024-06-16 ENCOUNTER — Ambulatory Visit (INDEPENDENT_AMBULATORY_CARE_PROVIDER_SITE_OTHER): Admitting: Physician Assistant

## 2024-06-16 ENCOUNTER — Encounter: Payer: Self-pay | Admitting: Obstetrics and Gynecology

## 2024-06-16 ENCOUNTER — Ambulatory Visit: Payer: Managed Care, Other (non HMO) | Admitting: Family Medicine

## 2024-06-16 DIAGNOSIS — F411 Generalized anxiety disorder: Secondary | ICD-10-CM

## 2024-06-16 DIAGNOSIS — E559 Vitamin D deficiency, unspecified: Secondary | ICD-10-CM | POA: Diagnosis not present

## 2024-06-16 DIAGNOSIS — Z308 Encounter for other contraceptive management: Secondary | ICD-10-CM

## 2024-06-16 DIAGNOSIS — F3341 Major depressive disorder, recurrent, in partial remission: Secondary | ICD-10-CM | POA: Diagnosis not present

## 2024-06-16 DIAGNOSIS — E785 Hyperlipidemia, unspecified: Secondary | ICD-10-CM

## 2024-06-16 DIAGNOSIS — R7301 Impaired fasting glucose: Secondary | ICD-10-CM

## 2024-06-16 NOTE — Progress Notes (Signed)
 New patient visit  Patient: Marilyn Li   DOB: 1986-03-29   38 y.o. Female  MRN: [id number] Visit Date: 06/16/2024  Today's healthcare provider: Jolynn Spencer, PA-C   Chief Complaint  Patient presents with   Transitions Of Care    TOC/NP/ Est care..  pt wants to discuss wt loss    Subjective    Marilyn Li is a 39 y.o. female who presents today as a new patient to establish care.   Discussed the use of AI scribe software for clinical note transcription with the patient, who gave verbal consent to proceed.  History of Present Illness Marilyn Li is a 38 year old female who presents for a follow-up regarding weight management and medication review.  She exercises over 150 minutes per week but acknowledges dietary habits need improvement, sometimes eating only once a day. She is concerned about diabetes prevention. Her last blood work was 3-4 months ago. She smokes occasionally, about twice a week, with three cigarettes each time. 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  Current medications include valacyclovir  500 mg daily for herpes simplex virus prevention, a daily birth control pill that has stopped heavy menstrual periods, and Buspar  as needed for anxiety. Anxiety is more prominent than depression currently. She was initially prescribed medication for anxiety and depression in 2008, later switching to Buspar .  No history of asthma, wheezing, vision problems, chest pain, shortness of breath, or rapid heart beating. Family history is negative for thyroid  cancer, pancreatic issues, or parathyroid problems. No personal history of pancreas or gallbladder issues.     06/16/2024    3:23 PM 12/17/2023   11:16 AM 09/01/2023    1:36 PM  PHQ9 SCORE ONLY  PHQ-9 Total Score 0 2 0     Past Medical History:  Diagnosis Date   Anemia 02/17/2021   Anxiety and depression    Ectopic pregnancy    Obesity    Recurrent vaginitis    Past Surgical History:   Procedure Laterality Date   ADENOIDECTOMY  1986   DILATION AND CURETTAGE OF UTERUS  2010   ECTOPIC PREGNANCY SURGERY  2006   Dr Nonda   TONSILLECTOMY  2007   Family Status  Relation Name Status   Mother  Alive   Father  Deceased  No partnership data on file   Family History  Problem Relation Age of Onset   Diabetes Mellitus II Mother    COPD Mother    Arthritis Mother    Stroke Father    Hypertension Father    Brain cancer Father        meningioma-died 04-21-2013  Diabetes Father    Social History   Socioeconomic History   Marital status: Single    Spouse name: Not on file   Number of children: Not on file   Years of education: Not on file   Highest education level: Some college, no degree  Occupational History   Not on file  Tobacco Use   Smoking status: Every Day    Types: Cigarettes    Passive exposure: Current   Smokeless tobacco: Never  Vaping Use   Vaping status: Never Used  Substance and Sexual Activity   Alcohol use: Yes    Comment: occasionally   Drug use: No   Sexual activity: Not Currently    Birth control/protection: Pill  Other Topics Concern   Not on file  Social History Narrative   Not on file   Social  Drivers of Health   Financial Resource Strain: Low Risk  (06/16/2024)   Overall Financial Resource Strain (CARDIA)    Difficulty of Paying Living Expenses: Not hard at all  Food Insecurity: No Food Insecurity (06/16/2024)   Hunger Vital Sign    Worried About Running Out of Food in the Last Year: Never true    Ran Out of Food in the Last Year: Never true  Transportation Needs: No Transportation Needs (06/16/2024)   PRAPARE - Administrator, Civil Service (Medical): No    Lack of Transportation (Non-Medical): No  Physical Activity: Sufficiently Active (06/16/2024)   Exercise Vital Sign    Days of Exercise per Week: 5 days    Minutes of Exercise per Session: 60 min  Stress: No Stress Concern Present (06/16/2024)    [employer] of Occupational Health - Occupational Stress Questionnaire    Feeling of Stress: Not at all  Social Connections: Unknown (06/16/2024)   Social Connection and Isolation Panel    Frequency of Communication with Friends and Family: More than three times a week    Frequency of Social Gatherings with Friends and Family: More than three times a week    Attends Religious Services: 1 to 4 times per year    Active Member of [employer] or Organizations: No    Attends Engineer, structural: Not on file    Marital Status: Not on file   Outpatient Medications Prior to Visit  Medication Sig   busPIRone  (BUSPAR ) 10 MG tablet Take 0.5-1 tablets (5-10 mg total) by mouth 2 (two) times daily as needed. for anxiety   celecoxib  (CELEBREX ) 200 MG capsule Take 1 capsule (200 mg total) by mouth 2 (two) times daily as needed.   norethindrone  (AYGESTIN ) 5 MG tablet Take 1 tablet (5 mg total) by mouth daily.   valACYclovir  (VALTREX ) 500 MG tablet Take 1 tablet (500 mg total) by mouth daily.   metroNIDAZOLE  (FLAGYL ) 500 MG tablet Take 1 tab BID for 7 days; NO alcohol use for 10 days after prescription start   No facility-administered medications prior to visit.   No Known Allergies  Immunization History  Administered Date(s) Administered   Influenza,inj,Quad PF,6+ Mos 09/13/2020   PFIZER(Purple Top)SARS-COV-2 Vaccination 09/13/2020, 10/11/2020   Tdap 09/01/2023    Health Maintenance  Topic Date Due   Pneumococcal Vaccine 64-71 Years old (1 of 2 - PCV) Never done   Hepatitis B Vaccines (1 of 3 - 19+ 3-dose series) Never done   HPV VACCINES (1 - 3-dose SCDM series) Never done   COVID-19 Vaccine (3 - 2024-25 season) 08/01/2023   INFLUENZA VACCINE  06/30/2024   Cervical Cancer Screening (HPV/Pap Cotest)  12/15/2027   DTaP/Tdap/Td (2 - Td or Tdap) 08/31/2033   Hepatitis C Screening  Completed   HIV Screening  Completed   Meningococcal B Vaccine  Aged Out    Patient Care Team: Elayah Klooster,  Sonali Wivell, PA-C as PCP - General (Physician Assistant)  Review of Systems  All other systems reviewed and are negative.  Except see HPI       Objective    BP 119/84 (BP Location: Left Arm, Patient Position: Sitting, Cuff Size: Normal)   Pulse 87   Resp 16   Ht 5' 4 (1.626 m)   Wt 240 lb (108.9 kg)   SpO2 100%   BMI 41.20 kg/m     Physical Exam Vitals reviewed.  Constitutional:      General: She is not in acute distress.  Appearance: Normal appearance. She is well-developed. She is obese. She is not diaphoretic.  HENT:     Head: Normocephalic and atraumatic.  Eyes:     General: No scleral icterus.    Conjunctiva/sclera: Conjunctivae normal.  Neck:     Thyroid : No thyromegaly.  Cardiovascular:     Rate and Rhythm: Normal rate and regular rhythm.     Pulses: Normal pulses.     Heart sounds: Normal heart sounds. No murmur heard. Pulmonary:     Effort: Pulmonary effort is normal. No respiratory distress.     Breath sounds: Normal breath sounds. No wheezing, rhonchi or rales.  Musculoskeletal:     Cervical back: Neck supple.     Right lower leg: No edema.     Left lower leg: No edema.  Lymphadenopathy:     Cervical: No cervical adenopathy.  Skin:    General: Skin is warm and dry.     Findings: No rash.  Neurological:     Mental Status: She is alert and oriented to person, place, and time. Mental status is at baseline.  Psychiatric:        Mood and Affect: Mood normal.        Behavior: Behavior normal.     Depression Screen    06/16/2024    3:23 PM 12/17/2023   11:16 AM 09/01/2023    1:36 PM 05/28/2023    8:18 AM  PHQ 2/9 Scores  PHQ - 2 Score 0 0 0 0  PHQ- 9 Score 0 2 0 1   No results found for any visits on 06/16/24.  Assessment & Plan      Assessment & Plan Morbid Obesity/Weight management Interested in weight loss medication. Aware of insurance requirements for coverage, including nutritionist program and exercise. Exceeds exercise requirement but  needs dietary improvements. Insurance coverage for medication uncertain. - Refer to nutritionist program if covered by insurance. - Check insurance coverage for weight loss medication. - Encourage maintaining exercise routine of 150 minutes per week. - Advise on dietary changes: regular meals, avoid eating 5-6 hours before bed, increase water intake.  General Health Maintenance Occasional smoker with elevated blood sugar and cholesterol. Advised on lifestyle modifications to prevent diabetes. Blood work needed to assess health status. - Order blood work to assess current health status. - Encourage healthy eating habits to prevent diabetes. - Schedule follow-up appointment in 6 weeks to 2 months.  Herpes Simplex Virus (HSV) Takes valacyclovir  500 mg daily to prevent transmission. No outbreaks or side effects. Discussed risks of long-term use and suggested episodic treatment. - Consider stopping daily valacyclovir  and switch to episodic treatment if symptoms arise. - Monitor for any signs of outbreaks or discomfort.  Anxiety Uses Buspar  as needed. Anxiety well-managed. However, advise to take buspar  daily due to its gradual onset of effect and requirement for consistent dosing to maintain therapeutic levels. Will reassess at the follow-up. Relaxation techniques advised. - Continue Buspar  for anxiety. Will follow-up  Encounter to establish care Welcomed to our clinic Reviewed past medical hx, social hx, family hx and surgical hx Pt advised to send all vaccination records or screening   Morbid obesity (HCC) (Primary) - Lipid panel - Hemoglobin A1c - CBC with Differential/Platelet - Comprehensive metabolic panel with GFR - TSH - Amb ref to Medical Nutrition Therapy-MNT  Generalized anxiety disorder Recurrent major depressive disorder, in partial remission (HCC)    06/16/2024    3:23 PM 12/17/2023   11:16 AM 09/01/2023    1:36 PM  PHQ9 SCORE ONLY  PHQ-9 Total Score 0 2 0       06/16/2024    3:23 PM 09/01/2023    1:36 PM 02/16/2022    4:22 PM 01/02/2022   10:30 AM  GAD 7 : Generalized Anxiety Score  Nervous, Anxious, on Edge 0 0 0 0  Control/stop worrying 0 0 0 0  Worry too much - different things 0 0 0 0  Trouble relaxing 0 0 0 0  Restless 0 0 0 0  Easily annoyed or irritable 0 0 0 0  Afraid - awful might happen 0 0 0 0  Total GAD 7 Score 0 0 0 0  Anxiety Difficulty Not difficult at all Not difficult at all  Not difficult at all    Chronic and stable Continue current regimen/buspar  10mg  Will follow-up   Vitamin D  deficiency Chronic Vit d 35.23 from 12/2023 In the past Advised vit d level to be checked Continue taking vit D level daily, up to 11-1998 units daily Will follow-up  Impaired fasting glucose In the past, Advised low carb diet and exercise A1C ordered and CMP Will follow-up  Dyslipidemia, goal LDL below 100 Chronic and previously stable Except HDL was low, at 32 from 12/2023 Advised lifestyle modifications LP ordered Will follow-up  Encounter for other contraceptive management Chronic Continue aygestin  5mg  Will follow-up   No follow-ups on file.    The patient was advised to call back or seek an in-person evaluation if the symptoms worsen or if the condition fails to improve as anticipated.  I discussed the assessment and treatment plan with the patient. The patient was provided an opportunity to ask questions and all were answered. The patient agreed with the plan and demonstrated an understanding of the instructions.  I, Joee Iovine, PA-C have reviewed all documentation for this visit. The documentation on  06/16/2024   for the exam, diagnosis, procedures, and orders are all accurate and complete.  Jolynn Spencer, [facility], MMS [facility] [phone] (phone) [phone] (fax)  [facility]

## 2024-06-19 NOTE — Progress Notes (Signed)
 Ostwalt, Janna, PA-C   Chief Complaint  Patient presents with   Vaginal Discharge    Slight sour odor, itching and irritation x few days.   Pelvic Pain    Little pain that started yesterday.    HPI:      Marilyn Li is a 38 y.o. [phone] whose LMP was No LMP recorded. (Menstrual status: Oral contraceptives)., presents today for increased creamy white vag d/c with odor/itching starting about a week ago, no meds to treat. Having LLQ pain since yesterday. No soaps/detergents/no recent abx use.  Long hx of recurrent BV, taking probiotics daily. Boric acid supp caused irritation. Using dove sens skin soap, no dryer sheets, cotton underwear. Did well with metrogel  twice wkly in past as preventive. Not sexually active in over a yr. Amenorrheic on POPs.  Had LLQ pain 4/25 with neg GYN u/s 5/25. Sx most likely MSK given pt's job. Hasn't started low back or pelvic stretches/exercise.    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   H/O iron deficiency 01/02/2022   Bilateral carpal tunnel syndrome 11/24/2021   Body mass index (BMI) of 39.0-39.9 in adult 02/17/2021   Tobacco use disorder 08/07/2019   Vitamin D  deficiency 07/07/2019   Generalized anxiety disorder 07/27/2018   Recurrent major depressive disorder, in partial remission (HCC) 07/27/2018    Past Surgical History:  Procedure Laterality Date   ADENOIDECTOMY  1986   DILATION AND CURETTAGE OF UTERUS  2010   ECTOPIC PREGNANCY SURGERY  2006   Dr Nonda   TONSILLECTOMY  2007    Family History  Problem Relation Age of Onset   Diabetes Mellitus II Mother    COPD Mother    Arthritis Mother    Stroke Father    Hypertension Father    Brain cancer Father        meningioma-died 04-15-13  Diabetes Father     Social History   Socioeconomic  History   Marital status: Single    Spouse name: Not on file   Number of children: Not on file   Years of education: Not on file   Highest education level: Some college, no degree  Occupational History   Not on file  Tobacco Use   Smoking status: Every Day    Types: Cigarettes    Passive exposure: Current   Smokeless tobacco: Never  Vaping Use   Vaping status: Never Used  Substance and Sexual Activity   Alcohol use: Yes    Comment: occasionally   Drug use: No   Sexual activity: Not Currently    Birth control/protection: Pill  Other Topics Concern   Not on file  Social History Narrative   Not on file   Social Drivers of Health   Financial Resource Strain: Low Risk  (06/16/2024)   Overall Financial Resource Strain (CARDIA)    Difficulty of Paying Living Expenses: Not hard at all  Food Insecurity: No Food Insecurity (06/16/2024)   Hunger Vital Sign    Worried About Running Out of Food in the Last Year: Never true    Ran Out of Food in the Last Year: Never true  Transportation Needs: No Transportation Needs (06/16/2024)   PRAPARE - Administrator, Civil Service (Medical): No    Lack of Transportation (Non-Medical): No  Physical Activity: Sufficiently  Active (06/16/2024)   Exercise Vital Sign    Days of Exercise per Week: 5 days    Minutes of Exercise per Session: 60 min  Stress: No Stress Concern Present (06/16/2024)   [employer] of Occupational Health - Occupational Stress Questionnaire    Feeling of Stress: Not at all  Social Connections: Unknown (06/16/2024)   Social Connection and Isolation Panel    Frequency of Communication with Friends and Family: More than three times a week    Frequency of Social Gatherings with Friends and Family: More than three times a week    Attends Religious Services: 1 to 4 times per year    Active Member of [employer] or Organizations: No    Attends Engineer, structural: Not on file    Marital Status: Not on file   Intimate Partner Violence: Not on file    Outpatient Medications Prior to Visit  Medication Sig Dispense Refill   busPIRone  (BUSPAR ) 10 MG tablet Take 0.5-1 tablets (5-10 mg total) by mouth 2 (two) times daily as needed. for anxiety 180 tablet 2   celecoxib  (CELEBREX ) 200 MG capsule Take 1 capsule (200 mg total) by mouth 2 (two) times daily as needed. 60 capsule 1   norethindrone  (AYGESTIN ) 5 MG tablet Take 1 tablet (5 mg total) by mouth daily. 90 tablet 3   valACYclovir  (VALTREX ) 500 MG tablet Take 1 tablet (500 mg total) by mouth daily. 90 tablet 3   methylPREDNISolone  (MEDROL  DOSEPAK) 4 MG TBPK tablet SMARTSIG:- Tablet(s) By Mouth - (Patient not taking: Reported on 06/20/2024)     No facility-administered medications prior to visit.      ROS:  Review of Systems  Constitutional:  Negative for fever.  Gastrointestinal:  Negative for blood in stool, constipation, diarrhea, nausea and vomiting.  Genitourinary:  Positive for pelvic pain and vaginal discharge. Negative for dyspareunia, dysuria, flank pain, frequency, hematuria, urgency, vaginal bleeding and vaginal pain.  Musculoskeletal:  Negative for back pain.  Skin:  Negative for rash.   BREAST: No symptoms   OBJECTIVE:   Vitals:  BP 102/67   Pulse 82   Ht 5' 4 (1.626 m)   Wt 251 lb (113.9 kg)   BMI 43.08 kg/m   Physical Exam Vitals reviewed.  Constitutional:      Appearance: She is well-developed.  Pulmonary:     Effort: Pulmonary effort is normal.  Abdominal:     Palpations: Abdomen is soft.     Tenderness: There is abdominal tenderness in the left lower quadrant. There is no guarding or rebound.  Genitourinary:    General: Normal vulva.     Pubic Area: No rash.      Labia:        Right: No rash, tenderness or lesion.        Left: No rash, tenderness or lesion.      Vagina: Normal. No vaginal discharge, erythema or tenderness.     Cervix: Normal.     Uterus: Normal. Not enlarged and not tender.       Adnexa: Right adnexa normal.       Right: No mass or tenderness.         Left: Tenderness present. No mass.    Musculoskeletal:        General: Normal range of motion.     Cervical back: Normal range of motion.  Skin:    General: Skin is warm and dry.  Neurological:     General: No focal deficit present.  Mental Status: She is alert and oriented to person, place, and time.  Psychiatric:        Mood and Affect: Mood normal.        Behavior: Behavior normal.        Thought Content: Thought content normal.        Judgment: Judgment normal.     Results: Results for orders placed or performed in visit on 06/20/24 (from the past 24 hours)  POCT Wet Prep with KOH     Status: Normal   Collection Time: 06/20/24  3:07 PM  Result Value Ref Range   Trichomonas, UA Negative    Clue Cells Wet Prep HPF POC neg    Epithelial Wet Prep HPF POC     Yeast Wet Prep HPF POC neg    Bacteria Wet Prep HPF POC     RBC Wet Prep HPF POC     WBC Wet Prep HPF POC     KOH Prep POC Negative Negative     Assessment/Plan: Vaginal discharge - Plan: NuSwab Vaginitis (VG), POCT Wet Prep with KOH; neg exam and wet prep. Check nuswab. Will f/u with results. If abn, may try clindamycin  and then resume metrogel  twice wkly as preventive.   LLQ pain--for 1 day, slightly tender on exam. Neg GYN u/s 5/25 with same sx. Low back/pelvic stretching/exercise for most likely MSK pain due to work.    Return if symptoms worsen or fail to improve.  Marjoria Mancillas B. Dannika Hilgeman, PA-C 06/20/2024 3:08 PM

## 2024-06-20 ENCOUNTER — Encounter: Payer: Self-pay | Admitting: Obstetrics and Gynecology

## 2024-06-20 ENCOUNTER — Ambulatory Visit: Admitting: Obstetrics and Gynecology

## 2024-06-20 VITALS — BP 102/67 | HR 82 | Ht 64.0 in | Wt 251.0 lb

## 2024-06-20 DIAGNOSIS — N898 Other specified noninflammatory disorders of vagina: Secondary | ICD-10-CM | POA: Diagnosis not present

## 2024-06-20 DIAGNOSIS — R1032 Left lower quadrant pain: Secondary | ICD-10-CM

## 2024-06-20 DIAGNOSIS — B9689 Other specified bacterial agents as the cause of diseases classified elsewhere: Secondary | ICD-10-CM

## 2024-06-20 LAB — POCT WET PREP WITH KOH
Clue Cells Wet Prep HPF POC: NEGATIVE
KOH Prep POC: NEGATIVE
Trichomonas, UA: NEGATIVE
Yeast Wet Prep HPF POC: NEGATIVE

## 2024-06-20 NOTE — Telephone Encounter (Signed)
 Spoke with pt at today's appt. Questions answered.

## 2024-06-20 NOTE — Patient Instructions (Signed)
 I value your feedback and you entrusting [place] with your care. If you get a [facility] patient survey, I would appreciate you taking the time to let us know about your experience today. Thank you! ? ? ?

## 2024-06-20 NOTE — Telephone Encounter (Signed)
 LMTRC

## 2024-06-22 ENCOUNTER — Ambulatory Visit: Payer: Self-pay | Admitting: Obstetrics and Gynecology

## 2024-06-22 LAB — NUSWAB VAGINITIS (VG)
Candida albicans, NAA: NEGATIVE
Candida glabrata, NAA: NEGATIVE
Trich vag by NAA: NEGATIVE

## 2024-06-22 MED ORDER — METRONIDAZOLE 0.75 % VA GEL: VAGINAL | 2 refills | Status: DC

## 2024-06-22 NOTE — Addendum Note (Signed)
 Addended by: WATT HILA B on: 06/22/2024 04:58 PM   Modules accepted: Orders

## 2024-07-10 ENCOUNTER — Encounter: Payer: Self-pay | Admitting: Physician Assistant

## 2024-08-11 ENCOUNTER — Encounter: Admitting: Family Medicine

## 2024-08-24 ENCOUNTER — Ambulatory Visit: Payer: Self-pay

## 2024-08-24 ENCOUNTER — Encounter: Payer: Self-pay | Admitting: Physician Assistant

## 2024-08-24 ENCOUNTER — Ambulatory Visit: Admitting: Physician Assistant

## 2024-08-24 VITALS — BP 122/74 | HR 84 | Temp 98.8°F | Resp 14 | Ht 64.0 in | Wt 249.0 lb

## 2024-08-24 DIAGNOSIS — F17210 Nicotine dependence, cigarettes, uncomplicated: Secondary | ICD-10-CM

## 2024-08-24 DIAGNOSIS — J029 Acute pharyngitis, unspecified: Secondary | ICD-10-CM

## 2024-08-24 LAB — POCT RAPID STREP A (OFFICE): Rapid Strep A Screen: NEGATIVE

## 2024-08-24 NOTE — Telephone Encounter (Signed)
 FYI Only or Action Required?: FYI only for provider.  Patient was last seen in primary care on 06/16/2024 by Ostwalt, Janna, PA-C.  Called Nurse Triage reporting Sore Throat.  Symptoms began yesterday.  Interventions attempted: Nothing.  Symptoms are: unchanged.  Triage Disposition: Home Care  Patient/caregiver understands and will follow disposition?: No  Copied from CRM #[id number]. Topic: Clinical - Red Word Triage >> Aug 24, 2024  9:06 AM Joesph NOVAK wrote: Red Word that prompted transfer to Nurse Triage: sore throat causing her pain. Reason for Disposition  [1] Sore throat is the only symptom AND [2] sore throat present < 48 hours  Answer Assessment - Initial Assessment Questions 1. ONSET: When did the throat start hurting? (Hours or days ago)      yesterday 2. SEVERITY: How bad is the sore throat? (Scale 1-10; mild, moderate or severe)     4 3. STREP EXPOSURE: Has there been any exposure to strep within the past week? If Yes, ask: What type of contact occurred?      denies 4.  VIRAL SYMPTOMS: Are there any symptoms of a cold, such as a runny nose, cough, hoarse voice or red eyes?      denies 5. FEVER: Do you have a fever? If Yes, ask: What is your temperature, how was it measured, and when did it start?     denies 6. PUS ON THE TONSILS: Is there pus on the tonsils in the back of your throat?     I've seen a little bit 7. OTHER SYMPTOMS: Do you have any other symptoms? (e.g., difficulty breathing, headache, rash)     denies 8. PREGNANCY: Is there any chance you are pregnant? When was your last menstrual period?     denies  Protocols used: Sore Throat-A-AH

## 2024-08-24 NOTE — Progress Notes (Signed)
 # Established patient visit  Patient: Marilyn Li   DOB: Nov 12, 1986   38 y.o. Female  MRN: [id number] Visit Date: 08/24/2024  Today's healthcare provider: Jolynn Spencer, PA-C   Chief Complaint  Patient presents with   Sore Throat    Difficulty swallowing, neck pain onset 1 day itc: motrin   Subjective      Discussed the use of AI scribe software for clinical note transcription with the patient, who gave verbal consent to proceed.  History of Present Illness Marilyn Li is a 38 year old female who presents with recurrent sore throat and pharyngitis symptoms.  She experiences a sore throat that began yesterday, with pain localized to the left side and worsened by swallowing. Chills and nasal congestion accompany the sore throat, but no fever is present. She has had similar episodes in the past, with the last occurrence treated with antibiotics for tonsillitis. Her tonsils were removed at age 71.  Previous tests for COVID-19, flu, and strep throat have been negative. She experiences a slight cough at night, waking her due to thickened saliva. She smokes about half a pack of cigarettes every few days and experiences occasional muffled sounds in her ears and nasal congestion, possibly due to allergies.  Warm salt gargles provide some relief, but pain persists when swallowing. She has not checked her temperature but suspects a fever due to chills.       06/16/2024    3:23 PM 12/17/2023   11:16 AM 09/01/2023    1:36 PM  Depression screen PHQ 2/9  Decreased Interest 0 0 0  Down, Depressed, Hopeless 0 0 0  PHQ - 2 Score 0 0 0  Altered sleeping 0 1 0  Tired, decreased energy 0 1 0  Change in appetite 0 0 0  Feeling bad or failure about yourself  0 0 0  Trouble concentrating 0 0 0  Moving slowly or fidgety/restless 0 0 0  Suicidal thoughts 0 0 0  PHQ-9 Score 0 2 0  Difficult doing work/chores Not difficult at all Not difficult at all Not difficult at all      06/16/2024     3:23 PM 09/01/2023    1:36 PM 02/16/2022    4:22 PM 01/02/2022   10:30 AM  GAD 7 : Generalized Anxiety Score  Nervous, Anxious, on Edge 0 0 0 0  Control/stop worrying 0 0 0 0  Worry too much - different things 0 0 0 0  Trouble relaxing 0 0 0 0  Restless 0 0 0 0  Easily annoyed or irritable 0 0 0 0  Afraid - awful might happen 0 0 0 0  Total GAD 7 Score 0 0 0 0  Anxiety Difficulty Not difficult at all Not difficult at all  Not difficult at all    Medications: Outpatient Medications Prior to Visit  Medication Sig   busPIRone  (BUSPAR ) 10 MG tablet Take 0.5-1 tablets (5-10 mg total) by mouth 2 (two) times daily as needed. for anxiety   celecoxib  (CELEBREX ) 200 MG capsule Take 1 capsule (200 mg total) by mouth 2 (two) times daily as needed.   metroNIDAZOLE  (METROGEL ) 0.75 % vaginal gel Apply one applicatorful to vagina twice weekly as preventive for 3 months   norethindrone  (AYGESTIN ) 5 MG tablet Take 1 tablet (5 mg total) by mouth daily.   valACYclovir  (VALTREX ) 500 MG tablet Take 1 tablet (500 mg total) by mouth daily.   [DISCONTINUED] methylPREDNISolone  (MEDROL  DOSEPAK) 4 MG TBPK tablet SMARTSIG:-  Tablet(s) By Mouth - (Patient not taking: Reported on 06/20/2024)   No facility-administered medications prior to visit.    Review of Systems All negative Except see HPI       Objective    BP 122/74   Pulse 84   Temp 98.8 F (37.1 C) (Oral)   Resp 14   Ht 5' 4 (1.626 m)   Wt 249 lb (112.9 kg)   SpO2 100%   BMI 42.74 kg/m     Physical Exam Vitals reviewed.  Constitutional:      General: She is not in acute distress.    Appearance: Normal appearance. She is well-developed. She is not diaphoretic.  HENT:     Head: Normocephalic and atraumatic.  Eyes:     General: No scleral icterus.    Conjunctiva/sclera: Conjunctivae normal.  Neck:     Thyroid : No thyromegaly.  Cardiovascular:     Rate and Rhythm: Normal rate and regular rhythm.     Pulses: Normal pulses.      Heart sounds: Normal heart sounds. No murmur heard. Pulmonary:     Effort: Pulmonary effort is normal. No respiratory distress.     Breath sounds: Normal breath sounds. No wheezing, rhonchi or rales.  Musculoskeletal:     Cervical back: Neck supple.     Right lower leg: No edema.     Left lower leg: No edema.  Lymphadenopathy:     Cervical: No cervical adenopathy.  Skin:    General: Skin is warm and dry.     Findings: No rash.  Neurological:     Mental Status: She is alert and oriented to person, place, and time. Mental status is at baseline.  Psychiatric:        Mood and Affect: Mood normal.        Behavior: Behavior normal.      Results for orders placed or performed in visit on 08/24/24  POCT rapid strep A  Result Value Ref Range   Rapid Strep A Screen Negative Negative        Assessment & Plan Acute pharyngitis with nasal congestion and cough Recurrent pharyngitis likely viral. Smoking may exacerbate symptoms. - Advise warm salt water gargles with baking soda and tea tree oil if tolerated. - Recommend over-the-counter Cepacol or Zicam for throat pain relief. - Encourage use of hot tea with honey for soothing throat. - Suggest nasal saline rinse for nasal congestion. - Recommend over-the-counter Zyrtec for allergy relief. - Advise use of Flonase or Nasacort for nasal congestion. - Suggest Mucinex DM for cough if no contraindications. - Monitor symptoms and contact if fever or symptoms worsen. - Consider antibiotics if symptoms do not improve or worsen. - Provide work note for rest and treatment.  Tobacco use Current smoker, half a pack every few days. Smoking may contribute to recurrent pharyngitis and respiratory symptoms. Cessation encouraged will revisit   Orders Placed This Encounter  Procedures   POCT rapid strep A    No follow-ups on file.   The patient was advised to call back or seek an in-person evaluation if the symptoms worsen or if the condition  fails to improve as anticipated.  I discussed the assessment and treatment plan with the patient. The patient was provided an opportunity to ask questions and all were answered. The patient agreed with the plan and demonstrated an understanding of the instructions.  I, Leeba Barbe, PA-C have reviewed all documentation for this visit. The documentation on 08/24/2024  for the exam,  diagnosis, procedures, and orders are all accurate and complete.  Jolynn Spencer, [facility], MMS [facility] [phone] (phone) [phone] (fax)  [facility]

## 2024-08-25 ENCOUNTER — Encounter: Payer: Self-pay | Admitting: Physician Assistant

## 2024-08-25 LAB — LIPID PANEL
Chol/HDL Ratio: 3.8 ratio (ref 0.0–4.4)
Cholesterol, Total: 130 mg/dL (ref 100–199)
HDL: 34 mg/dL — ABNORMAL LOW (ref >39)
LDL Chol Calc (NIH): 85 mg/dL (ref 0–99)
Triglycerides: 50 mg/dL (ref 0–149)
VLDL Cholesterol Cal: 11 mg/dL (ref 5–40)

## 2024-08-25 LAB — COMPREHENSIVE METABOLIC PANEL WITH GFR
ALT: 9 IU/L (ref 0–32)
AST: 16 IU/L (ref 0–40)
Albumin: 4.2 g/dL (ref 3.9–4.9)
Alkaline Phosphatase: 48 IU/L (ref 41–116)
BUN/Creatinine Ratio: 11 (ref 9–23)
BUN: 12 mg/dL (ref 6–24)
Bilirubin Total: 0.7 mg/dL (ref 0.0–1.2)
CO2: 19 mmol/L — ABNORMAL LOW (ref 20–29)
Calcium: 8.9 mg/dL (ref 8.7–10.2)
Chloride: 106 mmol/L (ref 96–106)
Creatinine, Ser: 1.05 mg/dL — ABNORMAL HIGH (ref 0.57–1.00)
Globulin, Total: 2.5 g/dL (ref 1.5–4.5)
Glucose: 86 mg/dL (ref 70–99)
Potassium: 4.5 mmol/L (ref 3.5–5.2)
Sodium: 140 mmol/L (ref 134–144)
Total Protein: 6.7 g/dL (ref 6.0–8.5)
eGFR: 69 mL/min/1.73 (ref >59)

## 2024-08-25 LAB — CBC WITH DIFFERENTIAL/PLATELET
Basophils Absolute: 0 x10E3/uL (ref 0.0–0.2)
Basos: 0 %
EOS (ABSOLUTE): 0.1 x10E3/uL (ref 0.0–0.4)
Eos: 1 %
Hematocrit: 40.8 % (ref 34.0–46.6)
Hemoglobin: 13.2 g/dL (ref 11.1–15.9)
Immature Grans (Abs): 0 x10E3/uL (ref 0.0–0.1)
Immature Granulocytes: 0 %
Lymphocytes Absolute: 2.1 x10E3/uL (ref 0.7–3.1)
Lymphs: 31 %
MCH: 31.5 pg (ref 26.6–33.0)
MCHC: 32.4 g/dL (ref 31.5–35.7)
MCV: 97 fL (ref 79–97)
Monocytes Absolute: 0.5 x10E3/uL (ref 0.1–0.9)
Monocytes: 8 %
Neutrophils Absolute: 4 x10E3/uL (ref 1.4–7.0)
Neutrophils: 60 %
Platelets: 315 x10E3/uL (ref 150–450)
RBC: 4.19 x10E6/uL (ref 3.77–5.28)
RDW: 12.1 % (ref 11.7–15.4)
WBC: 6.7 x10E3/uL (ref 3.4–10.8)

## 2024-08-25 LAB — HEMOGLOBIN A1C
Est. average glucose Bld gHb Est-mCnc: 100 mg/dL
Hgb A1c MFr Bld: 5.1 % (ref 4.8–5.6)

## 2024-08-25 LAB — TSH: TSH: 1.19 u[IU]/mL (ref 0.450–4.500)

## 2024-08-28 ENCOUNTER — Ambulatory Visit: Payer: Self-pay | Admitting: Physician Assistant

## 2024-08-28 ENCOUNTER — Other Ambulatory Visit: Payer: Self-pay

## 2024-08-30 NOTE — Progress Notes (Deleted)
 Medical Nutrition Therapy  Appointment Start time:  ***  Appointment End time:  ***  Primary concerns today: ***  Referral diagnosis: Morbid obesity (HCC)  Preferred learning style: *** (auditory, visual, hands on, no preference indicated) Learning readiness: *** (not ready, contemplating, ready, change in progress)   NUTRITION ASSESSMENT    Clinical Medical Hx: *** Medications: *** Labs: *** Notable Signs/Symptoms: ***  Lifestyle & Dietary Hx ***  Estimated daily fluid intake: *** oz Supplements: *** Sleep: *** Stress / self-care: *** Current average weekly physical activity: ***  24-Hr Dietary Recall First Meal: *** Snack: *** Second Meal: *** Snack: *** Third Meal: *** Snack: *** Beverages: ***   NUTRITION DIAGNOSIS  {CHL AMB NUTRITIONAL DIAGNOSIS:[id number]}   NUTRITION INTERVENTION  Nutrition education (E-1) on the following topics:  ***  Handouts Provided Include  ***  Learning Style & Readiness for Change Teaching method utilized: Visual & Auditory  Demonstrated degree of understanding via: Teach Back  Barriers to learning/adherence to lifestyle change: ***  Goals Established by Pt ***   MONITORING & EVALUATION Dietary intake, weekly physical activity, and *** in ***.  Next Steps  Patient is to ***.

## 2024-09-04 ENCOUNTER — Ambulatory Visit: Admitting: Dietician

## 2024-10-02 ENCOUNTER — Encounter: Payer: Self-pay | Admitting: Radiology

## 2024-10-05 ENCOUNTER — Ambulatory Visit: Admitting: Obstetrics and Gynecology

## 2024-10-05 ENCOUNTER — Encounter: Payer: Self-pay | Admitting: Obstetrics and Gynecology

## 2024-10-05 VITALS — BP 112/72 | HR 80 | Ht 64.0 in | Wt 245.0 lb

## 2024-10-05 DIAGNOSIS — N3001 Acute cystitis with hematuria: Secondary | ICD-10-CM

## 2024-10-05 DIAGNOSIS — R3121 Asymptomatic microscopic hematuria: Secondary | ICD-10-CM

## 2024-10-05 DIAGNOSIS — N760 Acute vaginitis: Secondary | ICD-10-CM

## 2024-10-05 DIAGNOSIS — R1032 Left lower quadrant pain: Secondary | ICD-10-CM

## 2024-10-05 DIAGNOSIS — N898 Other specified noninflammatory disorders of vagina: Secondary | ICD-10-CM

## 2024-10-05 DIAGNOSIS — B9689 Other specified bacterial agents as the cause of diseases classified elsewhere: Secondary | ICD-10-CM

## 2024-10-05 LAB — POCT WET PREP WITH KOH
Clue Cells Wet Prep HPF POC: NEGATIVE
KOH Prep POC: NEGATIVE
Trichomonas, UA: NEGATIVE
Yeast Wet Prep HPF POC: NEGATIVE

## 2024-10-05 LAB — POCT URINALYSIS DIPSTICK
Bilirubin, UA: NEGATIVE
Glucose, UA: NEGATIVE
Ketones, UA: NEGATIVE
Leukocytes, UA: NEGATIVE
Nitrite, UA: NEGATIVE
Protein, UA: NEGATIVE
Spec Grav, UA: 1.02 (ref 1.010–1.025)
pH, UA: 6.5 (ref 5.0–8.0)

## 2024-10-05 MED ORDER — METRONIDAZOLE 0.75 % VA GEL: VAGINAL | 2 refills | Status: AC

## 2024-10-05 MED ORDER — FLUCONAZOLE 150 MG PO TABS: 150.0000 mg | ORAL_TABLET | Freq: Once | ORAL | 0 refills | Status: AC

## 2024-10-05 MED ORDER — NITROFURANTOIN MONOHYD MACRO 100 MG PO CAPS: 100.0000 mg | ORAL_CAPSULE | Freq: Two times a day (BID) | ORAL | 0 refills | Status: AC

## 2024-10-05 NOTE — Progress Notes (Signed)
 Marilyn, Janna, PA-C   Chief Complaint  Patient presents with   Vaginal Itching    Discharge, sour odor x 1 week.   Urinary Tract Infection    Urgency/frequency urinating, slight back pain. Pain on left side of body that runs into left side pelvic area x 1.5 week    HPI:      [name] is a 38 y.o. [id number] whose LMP was Patient's last menstrual period was 09/13/2024 (approximate)., presents today for urinary frequency/urgency for the past 1 1/2 wks; with decreased flow. No dysuria/hematuria/fevers. Has LT low back pain, radiating a little to LLQ. Has had this in past and thought to be MSK due to job. Neg GYN u/s for similar sx 5/25. Sometimes has sx with BV. Drinking some water but not enough. Noticed spotting with wiping a few wks ago. On POPs for cycle control. Has also had vaginal itching with a little d/c, sour odor for the past wk. Hx of recurrent BV sx, currently doing metrogel  twice wkly as preventive with good results. No recent abx use. No meds to treat itch. Not sexually active in 2 yrs.   Patient Active Problem List   Diagnosis Date Noted   Sore throat 08/24/2024   Tinea corporis 08/10/2023   Dyslipidemia, goal LDL below 100 05/28/2023   Impaired fasting glucose 03/31/2023   Herpes simplex vulvovaginitis 12/14/2022   H/O iron deficiency 01/02/2022   Bilateral carpal tunnel syndrome 11/24/2021   Body mass index (BMI) of 39.0-39.9 in adult 02/17/2021   Tobacco use disorder 08/07/2019   Vitamin D  deficiency 07/07/2019   Generalized anxiety disorder 07/27/2018   Recurrent major depressive disorder, in partial remission 07/27/2018    Past Surgical History:  Procedure Laterality Date   ADENOIDECTOMY  1986   DILATION AND CURETTAGE OF UTERUS  2010   ECTOPIC PREGNANCY SURGERY  2006   Dr Nonda   TONSILLECTOMY  2007    Family History  Problem Relation Age of Onset   Diabetes Mellitus II Mother    COPD Mother    Arthritis Mother    Stroke Father     Hypertension Father    Brain cancer Father        meningioma-died March 22, 2013  Diabetes Father     Social History   Socioeconomic History   Marital status: Single    Spouse name: Not on file   Number of children: Not on file   Years of education: Not on file   Highest education level: Some college, no degree  Occupational History   Not on file  Tobacco Use   Smoking status: Every Day    Types: Cigarettes    Passive exposure: Current   Smokeless tobacco: Never  Vaping Use   Vaping status: Never Used  Substance and Sexual Activity   Alcohol use: Yes    Comment: occasionally   Drug use: No   Sexual activity: Not Currently    Birth control/protection: Pill  Other Topics Concern   Not on file  Social History Narrative   Not on file   Social Drivers of Health   Financial Resource Strain: Low Risk  (06/16/2024)   Overall Financial Resource Strain (CARDIA)    Difficulty of Paying Living Expenses: Not hard at all  Food Insecurity: No Food Insecurity (06/16/2024)   Hunger Vital Sign    Worried About Running Out of Food in the Last Year: Never true    Ran Out of Food in the Last  Year: Never true  Transportation Needs: No Transportation Needs (06/16/2024)   PRAPARE - Administrator, Civil Service (Medical): No    Lack of Transportation (Non-Medical): No  Physical Activity: Sufficiently Active (06/16/2024)   Exercise Vital Sign    Days of Exercise per Week: 5 days    Minutes of Exercise per Session: 60 min  Stress: No Stress Concern Present (06/16/2024)   [employer] of Occupational Health - Occupational Stress Questionnaire    Feeling of Stress: Not at all  Social Connections: Unknown (06/16/2024)   Social Connection and Isolation Panel    Frequency of Communication with Friends and Family: More than three times a week    Frequency of Social Gatherings with Friends and Family: More than three times a week    Attends Religious Services: 1 to 4 times per year     Active Member of [employer] or Organizations: No    Attends Engineer, Structural: Not on file    Marital Status: Not on file  Intimate Partner Violence: Not on file    Outpatient Medications Prior to Visit  Medication Sig Dispense Refill   busPIRone  (BUSPAR ) 10 MG tablet Take 0.5-1 tablets (5-10 mg total) by mouth 2 (two) times daily as needed. for anxiety 180 tablet 2   norethindrone  (AYGESTIN ) 5 MG tablet Take 1 tablet (5 mg total) by mouth daily. 90 tablet 3   valACYclovir  (VALTREX ) 500 MG tablet Take 1 tablet (500 mg total) by mouth daily. 90 tablet 3   metroNIDAZOLE  (METROGEL ) 0.75 % vaginal gel Apply one applicatorful to vagina twice weekly as preventive for 3 months 70 g 2   celecoxib  (CELEBREX ) 200 MG capsule Take 1 capsule (200 mg total) by mouth 2 (two) times daily as needed. 60 capsule 1   No facility-administered medications prior to visit.      ROS:  Review of Systems  Constitutional:  Negative for fever.  Gastrointestinal:  Negative for blood in stool, constipation, diarrhea, nausea and vomiting.  Genitourinary:  Positive for frequency, urgency and vaginal discharge. Negative for dyspareunia, dysuria, flank pain, hematuria, vaginal bleeding and vaginal pain.  Musculoskeletal:  Negative for back pain.  Skin:  Negative for rash.   BREAST: No symptoms   OBJECTIVE:   Vitals:  BP 112/72   Pulse 80   Ht 5' 4 (1.626 m)   Wt 245 lb (111.1 kg)   LMP 09/13/2024 (Approximate)   BMI 42.05 kg/m   Physical Exam Vitals reviewed.  Constitutional:      Appearance: She is well-developed.  Pulmonary:     Effort: Pulmonary effort is normal.  Abdominal:     Palpations: Abdomen is soft.     Tenderness: There is no abdominal tenderness. There is no right CVA tenderness or left CVA tenderness.  Genitourinary:    General: Normal vulva.     Pubic Area: No rash.      Labia:        Right: No rash, tenderness or lesion.        Left: No rash, tenderness or lesion.       Vagina: Normal. No vaginal discharge, erythema or tenderness.     Cervix: Normal.     Uterus: Normal. Not enlarged and not tender.      Adnexa: Right adnexa normal.       Right: No mass or tenderness.         Left: Tenderness present. No mass.    Musculoskeletal:  General: Normal range of motion.     Cervical back: Normal range of motion.  Skin:    General: Skin is warm and dry.  Neurological:     General: No focal deficit present.     Mental Status: She is alert and oriented to person, place, and time.  Psychiatric:        Mood and Affect: Mood normal.        Behavior: Behavior normal.        Thought Content: Thought content normal.        Judgment: Judgment normal.     Results: Results for orders placed or performed in visit on 10/05/24 (from the past 24 hours)  POCT urinalysis dipstick     Status: Abnormal   Collection Time: 10/05/24 12:13 PM  Result Value Ref Range   Color, UA yellow    Clarity, UA cloudy    Glucose, UA Negative Negative   Bilirubin, UA neg    Ketones, UA neg    Spec Grav, UA 1.020 1.010 - 1.025   Blood, UA large    pH, UA 6.5 5.0 - 8.0   Protein, UA Negative Negative   Urobilinogen, UA     Nitrite, UA neg    Leukocytes, UA Negative Negative   Appearance     Odor    POCT Wet Prep with KOH     Status: Normal   Collection Time: 10/05/24 12:13 PM  Result Value Ref Range   Trichomonas, UA Negative    Clue Cells Wet Prep HPF POC neg    Epithelial Wet Prep HPF POC     Yeast Wet Prep HPF POC neg    Bacteria Wet Prep HPF POC     RBC Wet Prep HPF POC     WBC Wet Prep HPF POC     KOH Prep POC Negative Negative   NO VAG BLEEDING ON EXAM  Assessment/Plan: Acute cystitis with hematuria - Plan: nitrofurantoin , macrocrystal-monohydrate, (MACROBID ) 100 MG capsule, fluconazole  (DIFLUCAN ) 150 MG tablet, POCT urinalysis dipstick, Urine Culture; pos sx and UA, no CVAT. Check C&S, treat with macrobid  (and diflucan  prn yeast vag with abx use). Will f/u  with results. If C&S neg, will recheck urine due to hematuria.   LLQ pain--slightly tender on exam, treat for UTI. Neg GYN eval in past.   Vaginal discharge - Plan: POCT Wet Prep with KOH, NuSwab Vaginitis (VG); neg wet prep, check culture. If neg, cont metrogel  twice wkly as preventive. Rx RF eRxd.   BV (bacterial vaginosis) - Plan: metroNIDAZOLE  (METROGEL ) 0.75 % vaginal gel    Meds ordered this encounter  Medications   nitrofurantoin , macrocrystal-monohydrate, (MACROBID ) 100 MG capsule    Sig: Take 1 capsule (100 mg total) by mouth 2 (two) times daily for 5 days.    Dispense:  10 capsule    Refill:  0    Supervising Provider:   ROBY, MICIA [[id number]]   fluconazole  (DIFLUCAN ) 150 MG tablet    Sig: Take 1 tablet (150 mg total) by mouth once for 1 dose.    Dispense:  1 tablet    Refill:  0    Supervising Provider:   ROBY, MICIA [[id number]]   metroNIDAZOLE  (METROGEL ) 0.75 % vaginal gel    Sig: Apply one applicatorful to vagina twice weekly as preventive for 3 months    Dispense:  70 g    Refill:  2    Supervising Provider:   LEIGH SOBER [[id number]]      Return  if symptoms worsen or fail to improve.  Deanda Ruddell B. Delonda Coley, PA-C 10/05/2024 12:14 PM

## 2024-10-05 NOTE — Patient Instructions (Signed)
 I value your feedback and you entrusting [place] with your care. If you get a [facility] patient survey, I would appreciate you taking the time to let us know about your experience today. Thank you! ? ? ?

## 2024-10-07 LAB — URINE CULTURE

## 2024-10-08 ENCOUNTER — Ambulatory Visit: Payer: Self-pay | Admitting: Obstetrics and Gynecology

## 2024-10-08 LAB — NUSWAB VAGINITIS (VG)
Candida albicans, NAA: NEGATIVE
Candida glabrata, NAA: NEGATIVE
Trich vag by NAA: NEGATIVE

## 2024-10-08 NOTE — Addendum Note (Signed)
 Addended by: WATT HILA B on: 10/08/2024 01:46 PM   Modules accepted: Orders

## 2024-10-24 ENCOUNTER — Ambulatory Visit: Admitting: Dietician

## 2024-11-19 ENCOUNTER — Ambulatory Visit
Admission: EM | Admit: 2024-11-19 | Discharge: 2024-11-19 | Disposition: A | Discharge status: Home / Self Care | Attending: Physician Assistant | Admitting: Physician Assistant

## 2024-11-19 ENCOUNTER — Encounter: Payer: Self-pay | Admitting: Emergency Medicine

## 2024-11-19 ENCOUNTER — Encounter: Payer: Self-pay | Admitting: Obstetrics and Gynecology

## 2024-11-19 DIAGNOSIS — R300 Dysuria: Secondary | ICD-10-CM | POA: Diagnosis not present

## 2024-11-19 DIAGNOSIS — N3001 Acute cystitis with hematuria: Secondary | ICD-10-CM | POA: Insufficient documentation

## 2024-11-19 DIAGNOSIS — T3695XA Adverse effect of unspecified systemic antibiotic, initial encounter: Secondary | ICD-10-CM | POA: Insufficient documentation

## 2024-11-19 DIAGNOSIS — R10A2 Flank pain, left side: Secondary | ICD-10-CM | POA: Diagnosis not present

## 2024-11-19 DIAGNOSIS — B379 Candidiasis, unspecified: Secondary | ICD-10-CM | POA: Insufficient documentation

## 2024-11-19 LAB — POCT URINE DIPSTICK
Bilirubin, UA: NEGATIVE
Glucose, UA: NEGATIVE mg/dL
Ketones, POC UA: NEGATIVE mg/dL
Nitrite, UA: NEGATIVE
Spec Grav, UA: 1.02
Urobilinogen, UA: 0.2 U/dL
pH, UA: 8.5 — AB

## 2024-11-19 MED ORDER — CEFDINIR 300 MG PO CAPS: 300.0000 mg | ORAL_CAPSULE | Freq: Two times a day (BID) | ORAL | 0 refills | Status: AC

## 2024-11-19 NOTE — ED Triage Notes (Signed)
 Patient c/o back pain, urinary frequency that started 2 days ago.

## 2024-11-19 NOTE — ED Notes (Signed)
 Patient unable to give more urine to send off for a urine culture

## 2024-11-19 NOTE — Discharge Instructions (Addendum)
-  Bring sample back in am for culture  UTI: Based on either symptoms or urinalysis, you may have a urinary tract infection. We will send the urine for culture and call with results in a few days. Begin antibiotics at this time. Your symptoms should be much improved over the next 2-3 days. Increase rest and fluid intake. If for some reason symptoms are worsening or not improving after a couple of days or the urine culture determines the antibiotics you are taking will not treat the infection, the antibiotics may be changed. Return or go to ER for fever, back pain, worsening urinary pain, discharge, increased blood in urine. May take Tylenol or Motrin OTC for pain relief or consider AZO if no contraindications

## 2024-11-19 NOTE — ED Provider Notes (Signed)
 " [facility]    CSN: [id number] Arrival date & time: 11/19/24  1526      History   Chief Complaint Chief Complaint  Patient presents with   Urinary Frequency    HPI Marilyn Li is a 38 y.o. female presenting for onset of dysuria, frequency, urgency, left flank pain today.  She also notes a little bit of blood when she wipes.  Denies pelvic pain, vaginal discharge, vaginal odor.  No fever, chills, sweats, body aches.  Patient believes she may have a urinary tract infection.  No OTC meds taken.  HPI  Past Medical History:  Diagnosis Date   Anemia 02/17/2021   Anxiety and depression    Ectopic pregnancy    Obesity    Recurrent vaginitis     Patient Active Problem List   Diagnosis Date Noted   Sore throat 08/24/2024   Tinea corporis 08/10/2023   Dyslipidemia, goal LDL below 100 05/28/2023   Impaired fasting glucose 03/31/2023   Herpes simplex vulvovaginitis 12/14/2022   H/O iron deficiency 01/02/2022   Bilateral carpal tunnel syndrome 11/24/2021   Body mass index (BMI) of 39.0-39.9 in adult 02/17/2021   Tobacco use disorder 08/07/2019   Vitamin D  deficiency 07/07/2019   Generalized anxiety disorder 07/27/2018   Recurrent major depressive disorder, in partial remission 07/27/2018    Past Surgical History:  Procedure Laterality Date   ADENOIDECTOMY  1986   DILATION AND CURETTAGE OF UTERUS  2010   ECTOPIC PREGNANCY SURGERY  2006   Dr Nonda   TONSILLECTOMY  2007    OB History     Gravida  4   Para  3   Term  3   Preterm      AB  1   Living  3      SAB      IAB      Ectopic  1   Multiple      Live Births  3        Obstetric Comments  Had a D&C postpartum after third baby for heavy bleeding.          Home Medications    Prior to Admission medications  Medication Sig Start Date End Date Taking? Authorizing Provider  busPIRone  (BUSPAR ) 10 MG tablet Take 0.5-1 tablets (5-10 mg total) by mouth 2 (two) times  daily as needed. for anxiety 12/17/23  Yes Wallace Search A, PA  cefdinir  (OMNICEF ) 300 MG capsule Take 1 capsule (300 mg total) by mouth 2 (two) times daily for 7 days. 11/19/24 11/26/24 Yes Arvis Huxley B, PA-C  norethindrone  (AYGESTIN ) 5 MG tablet Take 1 tablet (5 mg total) by mouth daily. 01/11/24  Yes Copland, Alicia B, PA-C  valACYclovir  (VALTREX ) 500 MG tablet Take 1 tablet (500 mg total) by mouth daily. 01/11/24  Yes Copland, Alicia B, PA-C  metroNIDAZOLE  (METROGEL ) 0.75 % vaginal gel Apply one applicatorful to vagina twice weekly as preventive for 3 months 10/05/24   Copland, Alicia B, PA-C    Family History Family History  Problem Relation Age of Onset   Diabetes Mellitus II Mother    COPD Mother    Arthritis Mother    Stroke Father    Hypertension Father    Brain cancer Father        meningioma-died Apr 27, 2013  Diabetes Father     Social History Social History[1]   Allergies   Patient has no known allergies.   Review of Systems Review of Systems  Constitutional:  Negative for chills, fatigue and fever.  Gastrointestinal:  Positive for abdominal pain. Negative for diarrhea, nausea and vomiting.  Genitourinary:  Positive for dysuria, flank pain, frequency, hematuria and urgency. Negative for decreased urine volume, pelvic pain, vaginal bleeding, vaginal discharge and vaginal pain.  Musculoskeletal:  Negative for back pain.  Skin:  Negative for rash.     Physical Exam Triage Vital Signs ED Triage Vitals  Encounter Vitals Group     BP      Girls Systolic BP Percentile      Girls Diastolic BP Percentile      Boys Systolic BP Percentile      Boys Diastolic BP Percentile      Pulse      Resp      Temp      Temp src      SpO2      Weight      Height      Head Circumference      Peak Flow      Pain Score      Pain Loc      Pain Education      Exclude from Growth Chart    No data found.  Updated Vital Signs BP (!) 144/93 (BP Location: Left Arm)   Pulse  82   Temp 98 F (36.7 C) (Oral)   Resp 15   Ht 5' 4 (1.626 m)   Wt 244 lb 14.9 oz (111.1 kg)   SpO2 100%   BMI 42.04 kg/m   Physical Exam Vitals and nursing note reviewed.  Constitutional:      General: She is not in acute distress.    Appearance: Normal appearance. She is not ill-appearing or toxic-appearing.  HENT:     Head: Normocephalic and atraumatic.  Eyes:     General: No scleral icterus.       Right eye: No discharge.        Left eye: No discharge.     Conjunctiva/sclera: Conjunctivae normal.  Cardiovascular:     Rate and Rhythm: Normal rate and regular rhythm.     Heart sounds: Normal heart sounds.  Pulmonary:     Effort: Pulmonary effort is normal. No respiratory distress.     Breath sounds: Normal breath sounds.  Abdominal:     Palpations: Abdomen is soft.     Tenderness: There is abdominal tenderness (suprapubic). There is left CVA tenderness. There is no right CVA tenderness.  Musculoskeletal:     Cervical back: Neck supple.  Skin:    General: Skin is dry.  Neurological:     General: No focal deficit present.     Mental Status: She is alert. Mental status is at baseline.     Motor: No weakness.     Gait: Gait normal.  Psychiatric:        Mood and Affect: Mood normal.        Behavior: Behavior normal.      UC Treatments / Results  Labs (all labs ordered are listed, but only abnormal results are displayed) Labs Reviewed  POCT URINE DIPSTICK - Abnormal; Notable for the following components:      Result Value   Clarity, UA cloudy (*)    Blood, UA moderate (*)    pH, UA 8.5 (*)    Protein Ur, POC trace (*)    Leukocytes, UA Small (1+) (*)    All other components within normal limits  URINE CULTURE    EKG  Radiology No results found.  Procedures Procedures (including critical care time)  Medications Ordered in UC Medications - No data to display  Initial Impression / Assessment and Plan / UC Course  I have reviewed the triage vital  signs and the nursing notes.  Pertinent labs & imaging results that were available during my care of the patient were reviewed by me and considered in my medical decision making (see chart for details).   38 year old female presents for onset of dysuria, frequency, urgency, light hematuria and left flank pain today.  Vitals are all stable.  She is afebrile.  Overall well-appearing.  No acute distress.  Has mild left CVA tenderness.  Urinalysis shows cloudy urine with moderate RBCs, trace protein, small leuks.  Will send for culture.  Patient unable to provide enough urine for culture at this time.  Advised her to bring a sample back in the morning.  Advised her to collect it tonight and put it in her refrigerator.  Acute urinary tract infection with possible ascending infection given left flank pain.  Sent cefdinir .  Encouraged increasing rest and fluids.  Advised will amend treatment if needed based on culture results.  Reviewed supportive care.  Discussed return and ER precautions.   Final Clinical Impressions(s) / UC Diagnoses   Final diagnoses:  Dysuria  Antibiotic-induced yeast infection  Acute cystitis with hematuria  Left flank pain     Discharge Instructions      UTI: Based on either symptoms or urinalysis, you may have a urinary tract infection. We will send the urine for culture and call with results in a few days. Begin antibiotics at this time. Your symptoms should be much improved over the next 2-3 days. Increase rest and fluid intake. If for some reason symptoms are worsening or not improving after a couple of days or the urine culture determines the antibiotics you are taking will not treat the infection, the antibiotics may be changed. Return or go to ER for fever, back pain, worsening urinary pain, discharge, increased blood in urine. May take Tylenol or Motrin OTC for pain relief or consider AZO if no contraindications      ED Prescriptions     Medication Sig  Dispense Auth. Provider   cefdinir  (OMNICEF ) 300 MG capsule Take 1 capsule (300 mg total) by mouth 2 (two) times daily for 7 days. 14 capsule Arvis Jolan NOVAK, PA-C      PDMP not reviewed this encounter.     [1]  Social History Tobacco Use   Smoking status: Every Day    Types: Cigarettes    Passive exposure: Current   Smokeless tobacco: Never  Vaping Use   Vaping status: Never Used  Substance Use Topics   Alcohol use: Yes    Comment: occasionally   Drug use: No     Arvis Jolan NOVAK DEVONNA 11/19/24 1633  "

## 2024-11-21 ENCOUNTER — Ambulatory Visit: Admitting: Licensed Practical Nurse

## 2024-11-21 LAB — URINE CULTURE: Culture: NO GROWTH

## 2024-11-22 ENCOUNTER — Ambulatory Visit (HOSPITAL_COMMUNITY): Payer: Self-pay

## 2024-12-05 ENCOUNTER — Ambulatory Visit: Payer: Self-pay

## 2024-12-05 NOTE — Telephone Encounter (Signed)
 FYI Only or Action Required?: FYI only for provider: appointment scheduled on 1.8.26.  Patient was last seen in primary care on 08/24/2024 by Ostwalt, Janna, PA-C.  Called Nurse Triage reporting Back Pain.  Symptoms began several weeks ago.  Interventions attempted: OTC medications: tylenol and Prescription medications: cefdinir .  Symptoms are: some have improved, some have not.  Triage Disposition: See Physician Within 24 Hours, See PCP When Office is Open (Within 3 Days)  Patient/caregiver understands and will follow disposition?: Yes

## 2024-12-05 NOTE — Telephone Encounter (Signed)
" ° ° ° ° ° ° ° °  Reason for Triage: Kidney area / underneath stomach left side hurting.. went to UR for UTI 12/21.SABRA  Her pcp isn't available soon enough for her  Reason for Disposition  [1] MODERATE back pain (e.g., interferes with normal activities) AND [2] present > 3 days  [1] MODERATE pain (e.g., interferes with normal activities) AND [2] pain comes and goes (cramps) AND [3] present > 24 hours  (Exception: Pain with Vomiting or Diarrhea - see that Guideline.)  Answer Assessment - Initial Assessment Questions 11/19/24 woke up fine, later that day out of nowhere began to have UTI symptoms, urgency, going little bits, back pain. She went to UC was prescribed cefdinir  and completed that. She state her uti symptoms are gone except she is still having lower back/side pain, mostly on the left but has started to go to the right. She states she is also having lower left abdominal pain. Offered pt appt for Thursday, soonest available appt. Pt declined. Next available Friday, pt scheduled. RN gave care advice and when to go to ER or call us  back. Pt states understanding.    1. ONSET: When did the pain begin? (e.g., minutes, hours, days)     11/19/24 2. LOCATION: Where does it hurt? (upper, mid or lower back)     Lower back and left side (flank) 3. SEVERITY: How bad is the pain?  (e.g., Scale 1-10; mild, moderate, or severe)     6 4. RADIATION: Does the pain shoot into your legs or somewhere else?     Denies but states she is also having abdominal pain 5. CAUSE:  What do you think is causing the back pain?      She thought it was part of UTI 6. BACK OVERUSE:  Any recent lifting of heavy objects, strenuous work or exercise?      7. MEDICINES: What have you taken so far for the pain? (e.g., nothing, acetaminophen, NSAIDS)     tylenol 8. NEUROLOGIC SYMPTOMS: Do you have any weakness, numbness, or problems with bowel/bladder control?     denies 9. OTHER SYMPTOMS: Do you have any other  symptoms? (e.g., fever, abdomen pain, burning with urination, blood in urine)       Abdominal pain  Answer Assessment - Initial Assessment Questions 1. LOCATION: Where does it hurt?      Lower left abdomen 2. RADIATION: Does the pain shoot anywhere else? (e.g., chest, back)     denies 3. ONSET: When did the pain begin? (e.g., minutes, hours or days ago)      11/19/24 4. SEVERITY: How bad is the pain?  (e.g., Scale 1-10; mild, moderate, or severe)     Back pain is 6/10 5. CAUSE: What do you think is causing the stomach pain? (e.g., gallstones, recent abdominal surgery)     Thought it was UTI 6. OTHER SYMPTOMS: Do you have any other symptoms? (e.g., back pain, diarrhea, fever, urination pain, vomiting)       Back pain, no more urinary symptoms  Protocols used: Back Pain-A-AH, Abdominal Pain - Female-A-AH  "

## 2024-12-08 ENCOUNTER — Ambulatory Visit (INDEPENDENT_AMBULATORY_CARE_PROVIDER_SITE_OTHER): Admitting: Family Medicine

## 2024-12-08 ENCOUNTER — Ambulatory Visit: Admitting: Physician Assistant

## 2024-12-08 ENCOUNTER — Encounter: Payer: Self-pay | Admitting: Family Medicine

## 2024-12-08 VITALS — BP 131/91 | HR 82 | Resp 16 | Ht 64.0 in | Wt 243.6 lb

## 2024-12-08 DIAGNOSIS — M545 Low back pain, unspecified: Secondary | ICD-10-CM

## 2024-12-08 DIAGNOSIS — R3129 Other microscopic hematuria: Secondary | ICD-10-CM | POA: Diagnosis not present

## 2024-12-08 MED ORDER — SULFAMETHOXAZOLE-TRIMETHOPRIM 800-160 MG PO TABS: 1.0000 | ORAL_TABLET | Freq: Two times a day (BID) | ORAL | 0 refills | Status: AC

## 2024-12-08 NOTE — Progress Notes (Signed)
 "     Established patient visit   Patient: Marilyn Li   DOB: August 30, 1986   39 y.o. Female  MRN: [id number] Visit Date: 12/08/2024  Today's healthcare provider: Nancyann Perry, MD   Chief Complaint  Patient presents with   Acute Visit    Went to urgent care 11/19/24 for back pain dx with UTI. Complains of ongoing pain LL Back radiates to upper back. No otc medication taken to help with pain only antibiotics given. Some frequent urination but comes and goes    Subjective    Discussed the use of AI scribe software for clinical note transcription with the patient, who gave verbal consent to proceed.  History of Present Illness   Marilyn Li is a 39 year old female who presents with persistent back and side pain following a urinary tract infection.  She has been experiencing back and side pain for the past couple of weeks, which began suddenly while she was out shopping. Initially, the pain was on her left side but has since also affected her right side. Despite treatment for a urinary tract infection, the pain persists.  She visited urgent care on the twenty first, where she was diagnosed with a urinary tract infection due to presence of blood and leukocytes on dipstick u/a. At that time, she experienced chills and frequent urination with reduced flow. She was prescribed cefdinir , which she took for seven days. While the urinary symptoms improved, the pain in her back and side did not resolve.  A urine culture was collected after she started the antibiotic and was negative. The pain never completely resolved after finishing the antibiotic course.  She denies any history of kidney stones. She continues to experience chills, primarily at night, but denies fever or sweats during the day.       Medications: Show/hide medication list[1] Review of Systems  Constitutional:  Negative for appetite change, chills, fatigue and fever.  Respiratory:  Negative for chest tightness and  shortness of breath.   Cardiovascular:  Negative for chest pain and palpitations.  Gastrointestinal:  Negative for abdominal pain, nausea and vomiting.  Neurological:  Negative for dizziness and weakness.       Objective    BP (!) 131/91   Pulse 82   Resp 16   Ht 5' 4 (1.626 m)   Wt 243 lb 9.6 oz (110.5 kg)   SpO2 100%   BMI 41.81 kg/m   Physical Exam   General appearance: Obese female, cooperative and in no acute distress Head: Normocephalic, without obvious abnormality, atraumatic Respiratory: Respirations even and unlabored, normal respiratory rate Extremities: All extremities are intact.  Skin: Skin color, texture, turgor normal. No rashes seen  Psych: Appropriate mood and affect. Back : bilateral mild CVAT, left more than right.       Assessment & Plan        Hematuria Persistent left-sided back and flank pain with chills. Initial cefdinir  improved urinary symptoms but not pain. Negative urine culture likely due to prior antibiotics. Differential includes kidney stones. - Obtained urine sample for urinalysis and culture. - Started empirical Septra  DS treatment while awaiting culture results. - Evaluate for kidney stones if culture is negative and symptoms persist.  Acute low back pain Left-sided low back pain radiating to the right, associated with urinary symptoms and chills. Pain persists despite antibiotic treatment. Differential includes kidney stones. - Evaluate for kidney stones if symptoms persist after antibiotic treatment.  Nancyann Perry, MD  [facility] [phone] (phone) [phone] (fax)  [facility]    [1]  Outpatient Medications Prior to Visit  Medication Sig   busPIRone  (BUSPAR ) 10 MG tablet Take 0.5-1 tablets (5-10 mg total) by mouth 2 (two) times daily as needed. for anxiety   metroNIDAZOLE  (METROGEL ) 0.75 % vaginal gel Apply one applicatorful to vagina twice weekly as preventive for 3  months   norethindrone  (AYGESTIN ) 5 MG tablet Take 1 tablet (5 mg total) by mouth daily.   valACYclovir  (VALTREX ) 500 MG tablet Take 1 tablet (500 mg total) by mouth daily.   No facility-administered medications prior to visit.   "

## 2024-12-12 LAB — SPECIMEN STATUS REPORT

## 2024-12-15 LAB — CULTURE, URINE COMPREHENSIVE

## 2024-12-15 LAB — SPECIMEN STATUS REPORT

## 2024-12-18 ENCOUNTER — Encounter: Admitting: Family Medicine

## 2024-12-25 NOTE — Progress Notes (Unsigned)
 "    Complete physical exam  Patient: Marilyn Li   DOB: 1986/03/14   39 y.o. Female  MRN: [id number] Visit Date: 12/27/2024  Today's healthcare provider: Jolynn Spencer, PA-C   No chief complaint on file.  Subjective    Marilyn Li is a 39 y.o. female who presents today for a complete physical exam.  She reports consuming a {diet types:17450} diet. {Exercise:19826} She generally feels {well/fairly well/poorly:18703}. She reports sleeping {well/fairly well/poorly:18703}. She {does/does not:200015} have additional problems to discuss today.  HPI  *** Discussed the use of AI scribe software for clinical note transcription with the patient, who gave verbal consent to proceed.  History of Present Illness     Last depression screening scores    06/16/2024    3:23 PM 12/17/2023   11:16 AM 09/01/2023    1:36 PM  PHQ 2/9 Scores  PHQ - 2 Score 0 0 0  PHQ- 9 Score 0  2  0      Data saved with a previous flowsheet row definition   Last fall risk screening    08/24/2024    3:47 PM  Fall Risk   Falls in the past year? 0  Number falls in past yr: 0  Injury with Fall? 0   Risk for fall due to : No Fall Risks  Follow up Falls evaluation completed     Data saved with a previous flowsheet row definition   Last Audit-C alcohol use screening    06/16/2024    8:12 AM  Alcohol Use Disorder Test (AUDIT)  1. How often do you have a drink containing alcohol? 0   3. How often do you have six or more drinks on one occasion? 0      Manually entered by patient   A score of 3 or more in women, and 4 or more in men indicates increased risk for alcohol abuse, EXCEPT if all of the points are from question 1   Past Medical History:  Diagnosis Date   Anemia 02/17/2021   Anxiety and depression    Ectopic pregnancy    Obesity    Recurrent vaginitis    Past Surgical History:  Procedure Laterality Date   ADENOIDECTOMY  1986   DILATION AND CURETTAGE OF UTERUS  2010    ECTOPIC PREGNANCY SURGERY  2006   Dr Nonda   TONSILLECTOMY  2007   Social History   Socioeconomic History   Marital status: Single    Spouse name: Not on file   Number of children: Not on file   Years of education: Not on file   Highest education level: Some college, no degree  Occupational History   Not on file  Tobacco Use   Smoking status: Every Day    Types: Cigarettes    Passive exposure: Current   Smokeless tobacco: Never  Vaping Use   Vaping status: Never Used  Substance and Sexual Activity   Alcohol use: Yes    Comment: occasionally   Drug use: No   Sexual activity: Not Currently    Birth control/protection: Pill  Other Topics Concern   Not on file  Social History Narrative   Not on file   Social Drivers of Health   Tobacco Use: High Risk (12/08/2024)   Patient History    Smoking Tobacco Use: Every Day    Smokeless Tobacco Use: Never    Passive Exposure: Current  Financial Resource Strain: Low Risk (06/16/2024)   Overall Financial Resource Strain (  CARDIA)    Difficulty of Paying Living Expenses: Not hard at all  Food Insecurity: No Food Insecurity (06/16/2024)   Epic    Worried About Programme Researcher, Broadcasting/film/video in the Last Year: Never true    Ran Out of Food in the Last Year: Never true  Transportation Needs: No Transportation Needs (06/16/2024)   Epic    Lack of Transportation (Medical): No    Lack of Transportation (Non-Medical): No  Physical Activity: Sufficiently Active (06/16/2024)   Exercise Vital Sign    Days of Exercise per Week: 5 days    Minutes of Exercise per Session: 60 min  Stress: No Stress Concern Present (06/16/2024)   [employer] of Occupational Health - Occupational Stress Questionnaire    Feeling of Stress: Not at all  Social Connections: Unknown (06/16/2024)   Social Connection and Isolation Panel    Frequency of Communication with Friends and Family: More than three times a week    Frequency of Social  Gatherings with Friends and Family: More than three times a week    Attends Religious Services: 1 to 4 times per year    Active Member of [employer] or Organizations: No    Attends Banker Meetings: Not on file    Marital Status: Not on file  Intimate Partner Violence: Not on file  Depression (PHQ2-9): Low Risk (06/16/2024)   Depression (PHQ2-9)    PHQ-2 Score: 0  Alcohol Screen: Not on file  Housing: Low Risk (06/16/2024)   Epic    Unable to Pay for Housing in the Last Year: No    Number of Times Moved in the Last Year: 0    Homeless in the Last Year: No  Utilities: Not on file  Health Literacy: Not on file   Family Status  Relation Name Status   Mother  Alive   Father  Deceased  No partnership data on file   Family History  Problem Relation Age of Onset   Diabetes Mellitus II Mother    COPD Mother    Arthritis Mother    Stroke Father    Hypertension Father    Brain cancer Father        meningioma-died 03-07-13  Diabetes Father    Allergies[1]  Patient Care Team: Johntavius Shepard, PA-C as PCP - General (Physician Assistant)   Medications: Show/hide medication list[2]  Review of Systems  All other systems reviewed and are negative.  Except see HPI  {Insert previous labs (optional):23779} {See past labs  Heme  Chem  Endocrine  Serology  Results Review (optional):1}  Objective    There were no vitals taken for this visit. {Insert last BP/Wt (optional):23777}{See vitals history (optional):1}    Physical Exam Vitals reviewed.  Constitutional:      General: She is not in acute distress.    Appearance: Normal appearance. She is well-developed. She is not diaphoretic.  HENT:     Head: Normocephalic and atraumatic.  Eyes:     General: No scleral icterus.    Conjunctiva/sclera: Conjunctivae normal.  Neck:     Thyroid : No thyromegaly.  Cardiovascular:     Rate and Rhythm: Normal rate and regular rhythm.     Pulses: Normal  pulses.     Heart sounds: Normal heart sounds. No murmur heard. Pulmonary:     Effort: Pulmonary effort is normal. No respiratory distress.     Breath sounds: Normal breath sounds. No wheezing, rhonchi or rales.  Musculoskeletal:     Cervical  back: Neck supple.     Right lower leg: No edema.     Left lower leg: No edema.  Lymphadenopathy:     Cervical: No cervical adenopathy.  Skin:    General: Skin is warm and dry.     Findings: No rash.  Neurological:     Mental Status: She is alert and oriented to person, place, and time. Mental status is at baseline.  Psychiatric:        Mood and Affect: Mood normal.        Behavior: Behavior normal.     No results found for any visits on 12/27/24.  Assessment & Plan    Routine Health Maintenance and Physical Exam  Exercise Activities and Dietary recommendations  Goals   None     Immunization History  Administered Date(s) Administered   Influenza,inj,Quad PF,6+ Mos 09/13/2020   PFIZER(Purple Top)SARS-COV-2 Vaccination 09/13/2020, 10/11/2020   Tdap 09/01/2023    Health Maintenance  Topic Date Due   Pneumococcal Vaccine (1 of 2 - PCV) Never done   Hepatitis B Vaccine (1 of 3 - 19+ 3-dose series) Never done   Breast Cancer Screening  Never done   Flu Shot  06/30/2024   COVID-19 Vaccine (2 - 2025-26 season) 07/31/2024   Pap with HPV screening  12/15/2027   DTaP/Tdap/Td vaccine (2 - Td or Tdap) 08/31/2033   HPV Vaccine (No Doses Required) Completed   Hepatitis C Screening  Completed   HIV Screening  Completed   Meningitis B Vaccine  Aged Out    Discussed health benefits of physical activity, and encouraged her to engage in regular exercise appropriate for her age and condition.  Assessment and Plan Assessment & Plan      ***  No follow-ups on file.    The patient was advised to call back or seek an in-person evaluation if the symptoms worsen or if the condition fails to improve as anticipated.  I  discussed the assessment and treatment plan with the patient. The patient was provided an opportunity to ask questions and all were answered. The patient agreed with the plan and demonstrated an understanding of the instructions.  I, Doneta Bayman, PA-C have reviewed all documentation for this visit. The documentation on 12/27/2024  for the exam, diagnosis, procedures, and orders are all accurate and complete.  Jolynn Spencer, [facility], MMS [facility] [phone] (phone) [phone] (fax)  [facility]     [1] No Known Allergies [2] Outpatient Medications Prior to Visit  Medication Sig   busPIRone  (BUSPAR ) 10 MG tablet Take 0.5-1 tablets (5-10 mg total) by mouth 2 (two) times daily as needed. for anxiety   metroNIDAZOLE  (METROGEL ) 0.75 % vaginal gel Apply one applicatorful to vagina twice weekly as preventive for 3 months   norethindrone  (AYGESTIN ) 5 MG tablet Take 1 tablet (5 mg total) by mouth daily.   valACYclovir  (VALTREX ) 500 MG tablet Take 1 tablet (500 mg total) by mouth daily.   No facility-administered medications prior to visit.  "

## 2024-12-27 ENCOUNTER — Encounter: Admitting: Physician Assistant

## 2024-12-27 DIAGNOSIS — E559 Vitamin D deficiency, unspecified: Secondary | ICD-10-CM

## 2024-12-27 DIAGNOSIS — F3341 Major depressive disorder, recurrent, in partial remission: Secondary | ICD-10-CM

## 2024-12-27 DIAGNOSIS — E785 Hyperlipidemia, unspecified: Secondary | ICD-10-CM

## 2024-12-27 DIAGNOSIS — F411 Generalized anxiety disorder: Secondary | ICD-10-CM

## 2024-12-27 DIAGNOSIS — F17210 Nicotine dependence, cigarettes, uncomplicated: Secondary | ICD-10-CM

## 2024-12-27 DIAGNOSIS — Z0000 Encounter for general adult medical examination without abnormal findings: Secondary | ICD-10-CM

## 2025-01-19 ENCOUNTER — Encounter: Admitting: Physician Assistant

## 2025-02-08 ENCOUNTER — Encounter: Admitting: Physician Assistant
